# Patient Record
Sex: Male | Born: 1970 | Race: Black or African American | Hispanic: No | Marital: Single | Smoking: Current some day smoker
Health system: Southern US, Community
[De-identification: ages and names within clinical notes are randomized; demographics above are authoritative.]

## PROBLEM LIST (undated history)

## (undated) DIAGNOSIS — E119 Type 2 diabetes mellitus without complications: Secondary | ICD-10-CM

## (undated) DIAGNOSIS — I10 Essential (primary) hypertension: Secondary | ICD-10-CM

## (undated) DIAGNOSIS — E785 Hyperlipidemia, unspecified: Secondary | ICD-10-CM

## (undated) DIAGNOSIS — I428 Other cardiomyopathies: Secondary | ICD-10-CM

## (undated) DIAGNOSIS — I5022 Chronic systolic (congestive) heart failure: Secondary | ICD-10-CM

## (undated) DIAGNOSIS — Z720 Tobacco use: Secondary | ICD-10-CM

## (undated) HISTORY — DX: Chronic systolic (congestive) heart failure: I50.22

## (undated) HISTORY — DX: Essential (primary) hypertension: I10

## (undated) HISTORY — PX: KNEE SURGERY: SHX244

## (undated) HISTORY — DX: Hyperlipidemia, unspecified: E78.5

## (undated) HISTORY — DX: Tobacco use: Z72.0

## (undated) HISTORY — DX: Type 2 diabetes mellitus without complications: E11.9

## (undated) HISTORY — DX: Other cardiomyopathies: I42.8

## (undated) HISTORY — PX: CARDIAC CATHETERIZATION: SHX172

---

## 2012-12-25 LAB — CBC
MCH: 29.8 pg (ref 26.0–34.0)
MCHC: 33.1 g/dL (ref 32.0–36.0)
MCV: 90 fL (ref 80–100)
Platelet: 383 10*3/uL (ref 150–440)
RBC: 4.68 10*6/uL (ref 4.40–5.90)
WBC: 7.4 10*3/uL (ref 3.8–10.6)

## 2012-12-25 LAB — BASIC METABOLIC PANEL
BUN: 9 mg/dL (ref 7–18)
Calcium, Total: 9 mg/dL (ref 8.5–10.1)
Co2: 27 mmol/L (ref 21–32)
Creatinine: 1.08 mg/dL (ref 0.60–1.30)
EGFR (African American): >60
Glucose: 147 mg/dL — ABNORMAL HIGH (ref 65–99)
Osmolality: 277 (ref 275–301)

## 2012-12-25 LAB — PROTIME-INR: INR: 0.9

## 2012-12-25 LAB — CK TOTAL AND CKMB (NOT AT ARMC)
CK-MB: 3.5 ng/mL (ref 0.5–3.6)
CK-MB: 2.6 ng/mL (ref 0.5–3.6)

## 2012-12-26 ENCOUNTER — Inpatient Hospital Stay: Payer: Self-pay | Admitting: Internal Medicine

## 2012-12-26 DIAGNOSIS — I059 Rheumatic mitral valve disease, unspecified: Secondary | ICD-10-CM

## 2012-12-26 DIAGNOSIS — R079 Chest pain, unspecified: Secondary | ICD-10-CM

## 2012-12-26 LAB — CK TOTAL AND CKMB (NOT AT ARMC)
CK, Total: 233 U/L — ABNORMAL HIGH (ref 35–232)
CK-MB: 2 ng/mL (ref 0.5–3.6)

## 2012-12-26 LAB — LIPID PANEL
HDL Cholesterol: 36 mg/dL — ABNORMAL LOW (ref 40–60)
Triglycerides: 265 mg/dL — ABNORMAL HIGH (ref 0–200)
VLDL Cholesterol, Calc: 53 mg/dL — ABNORMAL HIGH (ref 5–40)

## 2012-12-29 ENCOUNTER — Telehealth: Payer: Self-pay

## 2012-12-29 NOTE — Telephone Encounter (Signed)
TCM call Pt says he is feeling much better since d/c Denies sob or CP Says he has had all RX filled and confirms appt with [place] 1/15 Says he has a BP monitor but needs new batteries; getting these today so he can monitor BP

## 2012-12-29 NOTE — Telephone Encounter (Signed)
Message copied by Marcelle Overlie on Mon Dec 29, 2012  9:26 AM ------      Message from: Thersa Salt      Created: Mon Dec 29, 2012  9:00 AM      Regarding: tcm       12/31/12 with chris

## 2012-12-30 ENCOUNTER — Encounter: Payer: Self-pay | Admitting: *Deleted

## 2012-12-31 ENCOUNTER — Encounter: Payer: Self-pay | Admitting: Nurse Practitioner

## 2012-12-31 ENCOUNTER — Ambulatory Visit (INDEPENDENT_AMBULATORY_CARE_PROVIDER_SITE_OTHER): Payer: 59 | Admitting: Nurse Practitioner

## 2012-12-31 ENCOUNTER — Telehealth: Payer: Self-pay

## 2012-12-31 VITALS — BP 100/70 | HR 70 | Ht 70.0 in | Wt 249.0 lb

## 2012-12-31 DIAGNOSIS — R0602 Shortness of breath: Secondary | ICD-10-CM

## 2012-12-31 DIAGNOSIS — I509 Heart failure, unspecified: Secondary | ICD-10-CM

## 2012-12-31 DIAGNOSIS — I10 Essential (primary) hypertension: Secondary | ICD-10-CM

## 2012-12-31 DIAGNOSIS — I5022 Chronic systolic (congestive) heart failure: Secondary | ICD-10-CM

## 2012-12-31 DIAGNOSIS — Z720 Tobacco use: Secondary | ICD-10-CM

## 2012-12-31 DIAGNOSIS — F17200 Nicotine dependence, unspecified, uncomplicated: Secondary | ICD-10-CM

## 2012-12-31 DIAGNOSIS — E785 Hyperlipidemia, unspecified: Secondary | ICD-10-CM | POA: Insufficient documentation

## 2012-12-31 DIAGNOSIS — I428 Other cardiomyopathies: Secondary | ICD-10-CM

## 2012-12-31 NOTE — Patient Instructions (Addendum)
Your physician wants you to follow-up in: 1 month with Dr. Mariah Milling. You will receive a reminder letter in the mail two months in advance. If you don't receive a letter, please call our office to schedule the follow-up appointment.

## 2012-12-31 NOTE — Progress Notes (Signed)
Patient Name: Logan Kerr Date of Encounter: 12/31/2012  Primary Care Provider:  No primary provider on file. Primary Cardiologist:  Concha Se, MD  Patient Profile  42 year old male with recent diagnosis of nonischemic cardiopathy presents for followup.  Problem List   Past Medical History  Diagnosis Date  . Hypertension   . Tobacco abuse   . Hyperlipidemia   . Chronic systolic CHF (congestive heart failure)     a. 12/2012 Echo: Mod dilated LV, EF <25%, sev global HK, mild conc LVH, mod dil LA, mild to mod MR.  . Nonischemic cardiomyopathy     a. 12/2012 Ex MV: Ex time 8:01, EF 21%, sev glob HK, no isch/infarct,    Past Surgical History  Procedure Date  . Knee surgery     right    Allergies  No Known Allergies  HPI  42 year old male with the above problem list. He was recently admitted to [facility] secondary to progressive dyspnea and found to be in heart failure. He was markedly hypertensive and was admitted for further evaluation and diuresis. A 2-D echocardiogram showed reduced LV function with an EF of less than 25%. Cardiology was consult in and he underwent stress testing revealing no evidence of ischemia or infarct. Was felt that his cardiomyopathy was likely hypertensive in nature. He was placed on beta blocker ACE inhibitor and diuretic therapy and subsequently discharged. Since his discharge, he reports doing well without any chest pain or dyspnea on exertion. He has weighed himself a few times since discharge and his weight has been stable.  He denies pnd, orthopnea, n, v, dizziness, syncope, edema, weight gain, or early satiety.  Home Medications  Prior to Admission medications   Medication Sig Start Date End Date Taking? Authorizing Provider  aspirin 81 MG tablet Take 81 mg by mouth daily.   Yes Historical Provider, MD  carvedilol (COREG) 6.25 MG tablet Take 6.25 mg by mouth 2 (two) times daily with a meal.   Yes Historical Provider, MD    furosemide (LASIX) 20 MG tablet Take 20 mg by mouth daily.   Yes Historical Provider, MD  lisinopril (PRINIVIL,ZESTRIL) 40 MG tablet Take 40 mg by mouth daily.   Yes Historical Provider, MD  lovastatin (MEVACOR) 40 MG tablet Take 40 mg by mouth at bedtime.   Yes Historical Provider, MD   Review of Systems  He denies pnd, orthopnea, n, v, dizziness, syncope, edema, weight gain, or early satiety. All other systems reviewed and are otherwise negative except as noted above.  Physical Exam  Blood pressure 100/70, pulse 70, height 5\' 10"  (1.778 m), weight 249 lb (112.946 kg).  General: Pleasant, NAD Psych: Normal affect. Neuro: Alert and oriented X 3. Moves all extremities spontaneously. HEENT: Normal  Neck: Supple without bruits or JVD. Lungs:  Resp regular and unlabored, CTA. Heart: RRR no s3, s4, or murmurs. Abdomen: Soft, non-tender, non-distended, BS + x 4.  Extremities: No clubbing, cyanosis or edema. DP/PT/Radials 2+ and equal bilaterally.  Accessory Clinical Findings  ECG - regular sinus rhythm, 70, LVH with repolarization abnormalities, no acute ST or T changes.  Assessment & Plan  1.  Chronic systolic congestive heart failure/nonischemic cardio myopathy: Patient reports doing well. He believes his weight has been stable and have recommended that he check a weight daily. We've gone over the importance of symptom reporting along with medication and lifestyle compliance. His blood pressure is soft and thus there is no room to add spironolactone therapy or titrate his  bb/acei. He'll require followup 2-D echocardiogram in approximately 3 months to determine his candidacy for ICD placement.  2. Hypertension: This is much better and he has been compliant with his medications. No changes today.  3. Hyperlipidemia: LDL was 129 while hospitalized. He did not have clinical evidence of atherosclerotic coronary disease and a negative Myoview. Consider discontinuation of statin therapy based  on latest guidelines update- defer to Dr. Mariah Milling.  4.  Tobacco Abuse:  He's smoked one cigarette since d/c.  He is committed to quitting.  Complete cessation advised.  5.  Dispo:  F/u Dr. Mariah Milling in 3-4 wks.  Nicolasa Ducking, NP 12/31/2012, 2:45 PM

## 2012-12-31 NOTE — Telephone Encounter (Signed)
Pt called to say he has noticed muscle spasms in hands and neck associated with the feeling as if he is going to pass out since starting carvedilol at d/c from hospital I spoke with Ward Givens about this since he saw pt this am He suggests pt decrease carvedilol to 3.125 mg BID and hold lovastatin for a few days to see if this helps symptoms Pt will let us know how he is feeling Understanding verb

## 2013-01-01 LAB — BASIC METABOLIC PANEL
BUN/Creatinine Ratio: 13 (ref 9–20)
CO2: 27 mmol/L (ref 19–28)
Creatinine, Ser: 1.27 mg/dL (ref 0.76–1.27)
GFR calc Af Amer: 81 mL/min/{1.73_m2} (ref >59)
Sodium: 139 mmol/L (ref 134–144)

## 2013-01-02 ENCOUNTER — Telehealth: Payer: Self-pay

## 2013-01-02 NOTE — Telephone Encounter (Signed)
Patient is calling back regarding his two medication changes. The patient states he feels a little better off the lovastatin and since the decrease in the carvedilol. Please advise what to do?

## 2013-01-02 NOTE — Telephone Encounter (Signed)
Pt says his cramping is better as well as his dizziness since holding lovastatin and decreasing coreg to 3.125 mg BID He feels lovastatin was not causing issues I advised ok to try resuming lovastatin and continue on coreg 3.125 mg BID I advised him to stop lovastatin if cramping resumes and we may have to switch his to another chol med Understanding verb

## 2013-01-05 ENCOUNTER — Telehealth: Payer: Self-pay

## 2013-01-05 NOTE — Telephone Encounter (Signed)
Message copied by Marcelle Overlie on Mon Jan 05, 2013  8:06 AM ------      Message from: Antonieta Iba      Created: Sun Jan 04, 2013  3:51 PM      Regarding: RE: labs       Would repeat in a few weeks.      Avoid foods high in potassium            ----- Message -----         From: Marcelle Overlie, RN         Sent: 01/02/2013   8:32 AM           To: Antonieta Iba, MD      Subject: labs                                                     See most recent labs ordered by Ward Givens      Do you need to repeat BMP d/t K?

## 2013-01-05 NOTE — Telephone Encounter (Signed)
Pt says symptoms have improved since medication change Will avoid foods high in potassium

## 2013-01-05 NOTE — Telephone Encounter (Signed)
Assess symptoms

## 2013-01-05 NOTE — Telephone Encounter (Signed)
Message copied by [facility], Chablis Losh E on Mon Jan 05, 2013  8:00 AM ------      Message from: [facility], Chayanne Filippi E      Created: Wed Dec 31, 2012  2:54 PM       Assess symptoms

## 2013-02-02 ENCOUNTER — Ambulatory Visit (INDEPENDENT_AMBULATORY_CARE_PROVIDER_SITE_OTHER): Payer: Self-pay | Admitting: Cardiovascular Disease

## 2013-02-02 ENCOUNTER — Telehealth: Payer: Self-pay

## 2013-02-02 ENCOUNTER — Encounter: Payer: Self-pay | Admitting: Cardiovascular Disease

## 2013-02-02 VITALS — BP 189/115 | HR 69 | Ht 70.0 in | Wt 249.2 lb

## 2013-02-02 DIAGNOSIS — E785 Hyperlipidemia, unspecified: Secondary | ICD-10-CM

## 2013-02-02 DIAGNOSIS — I509 Heart failure, unspecified: Secondary | ICD-10-CM

## 2013-02-02 DIAGNOSIS — Z720 Tobacco use: Secondary | ICD-10-CM

## 2013-02-02 DIAGNOSIS — F17200 Nicotine dependence, unspecified, uncomplicated: Secondary | ICD-10-CM

## 2013-02-02 DIAGNOSIS — I10 Essential (primary) hypertension: Secondary | ICD-10-CM

## 2013-02-02 DIAGNOSIS — I5022 Chronic systolic (congestive) heart failure: Secondary | ICD-10-CM

## 2013-02-02 MED ORDER — HYDRALAZINE HCL 25 MG PO TABS: 25.0000 mg | ORAL_TABLET | Freq: Three times a day (TID) | ORAL | Status: DC

## 2013-02-02 MED ORDER — ISOSORBIDE MONONITRATE ER 30 MG PO TB24: 30.0000 mg | ORAL_TABLET | Freq: Two times a day (BID) | ORAL | Status: DC

## 2013-02-02 NOTE — Assessment & Plan Note (Addendum)
Currently with no symptoms. No significant weight change. Appears stable but blood pressure is very elevated. Additional medications added today include hydralazine 25 g twice a day and isosorbide 30 mg daily. He has no insurance and needs medications from the $4  List.

## 2013-02-02 NOTE — Telephone Encounter (Signed)
Not taking KCL

## 2013-02-02 NOTE — Telephone Encounter (Signed)
You saw pt this am Do we need a repeat BMP? See BMP done 1/17 by Ward Givens thanks

## 2013-02-02 NOTE — Patient Instructions (Addendum)
Please start isosorbide one a day Also start hydralazine one pill three times a day for blood pressure  Continue all other meds  Please call us if you have new issues that need to be addressed before your next appt.  Your physician wants you to follow-up in: 1 months.

## 2013-02-02 NOTE — Assessment & Plan Note (Signed)
We have encouraged him to continue to work on weaning his cigarettes and smoking cessation. He will continue to work on this and does not want any assistance with chantix.  

## 2013-02-02 NOTE — Telephone Encounter (Signed)
Would suggest he stop potassium if he is taking this

## 2013-02-02 NOTE — Assessment & Plan Note (Signed)
We have stressed to him the importance of blood pressure control. He is reluctant to start additional medications but we discussed the complications of severe hypertension and he is willing to work on his blood pressure. Hydralazine and isosorbide started. these can be titrated upwards as needed

## 2013-02-02 NOTE — Progress Notes (Signed)
  HPI Comments: 42 year old male  recently admitted to [facility] secondary to progressive dyspnea and found to be in heart failure, markedly hypertensive, 2-D echocardiogram showed reduced LV function with an EF of less than 25%.  stress testing revealing no evidence of ischemia or infarct.  cardiomyopathy was likely hypertensive in nature. He was placed on beta blocker ACE inhibitor and diuretic therapy and subsequently discharged.   Since his discharge, he reports doing well without any chest pain or dyspnea on exertion. He has weighed himself a few times since discharge and his weight has been stable.  He denies pnd, orthopnea, n, v, dizziness, syncope, edema, weight gain, or early satiety.  He is not checking his blood pressure frequently so does report periodic blood pressure measurements in the 150 range. Continues to have no symptoms.  EKG shows normal sinus rhythm with no significant ST or T wave changes  Outpatient Encounter Prescriptions as of 02/02/2013  Medication Sig Dispense Refill  . aspirin 81 MG tablet Take 81 mg by mouth daily.      . carvedilol (COREG) 6.25 MG tablet Take 6.25 mg by mouth 2 (two) times daily with a meal.      . furosemide (LASIX) 20 MG tablet Take 20 mg by mouth daily.      Marland Kitchen lisinopril (PRINIVIL,ZESTRIL) 40 MG tablet Take 40 mg by mouth daily.      Marland Kitchen lovastatin (MEVACOR) 40 MG tablet Take 40 mg by mouth at bedtime.        Review of Systems  Constitutional: Negative.   HENT: Negative.   Eyes: Negative.   Respiratory: Negative.   Cardiovascular: Negative.   Gastrointestinal: Negative.   Musculoskeletal: Negative.   Skin: Negative.   Neurological: Negative.   Psychiatric/Behavioral: Negative.   All other systems reviewed and are negative.   BP 189/115  Pulse 69  Ht 5\' 10"  (1.778 m)  Wt 249 lb 4 oz (113.059 kg)  BMI 35.76 kg/m2 Recheck of the blood pressure confirmed systolic pressure greater than 170, diastolic 110 Physical Exam  Nursing  note and vitals reviewed. Constitutional: He is oriented to person, place, and time. He appears well-developed and well-nourished.  HENT:  Head: Normocephalic.  Nose: Nose normal.  Mouth/Throat: Oropharynx is clear and moist.  Eyes: Conjunctivae are normal. Pupils are equal, round, and reactive to light.  Neck: Normal range of motion. Neck supple. No JVD present.  Cardiovascular: Normal rate, regular rhythm, S1 normal, S2 normal, normal heart sounds and intact distal pulses.  Exam reveals no gallop and no friction rub.   No murmur heard. Pulmonary/Chest: Effort normal and breath sounds normal. No respiratory distress. He has no wheezes. He has no rales. He exhibits no tenderness.  Abdominal: Soft. Bowel sounds are normal. He exhibits no distension. There is no tenderness.  Musculoskeletal: Normal range of motion. He exhibits no edema and no tenderness.  Lymphadenopathy:    He has no cervical adenopathy.  Neurological: He is alert and oriented to person, place, and time. Coordination normal.  Skin: Skin is warm and dry. No rash noted. No erythema.  Psychiatric: He has a normal mood and affect. His behavior is normal. Judgment and thought content normal.      Assessment and Plan

## 2013-02-02 NOTE — Assessment & Plan Note (Signed)
recommended he continue on his statin

## 2013-02-02 NOTE — Telephone Encounter (Signed)
Reminder for BMP

## 2013-02-09 ENCOUNTER — Telehealth: Payer: Self-pay | Admitting: *Deleted

## 2013-02-09 NOTE — Telephone Encounter (Signed)
Please advise thanks.

## 2013-02-09 NOTE — Telephone Encounter (Signed)
Pt stop taking the isosorbide and the hydralazine. Pt says it was giving him headache, body aches, chills and other problems. Wants to know what he should do

## 2013-02-11 NOTE — Telephone Encounter (Signed)
Need to figure out which medicine was causing the side effect Would retry hydralazine and hold isosorbide It is a very low dose Would call back in several days time to let us know how he feels

## 2013-02-12 NOTE — Telephone Encounter (Signed)
Pt informed Understanding verb We will call him back to check on him in a few days

## 2013-02-18 ENCOUNTER — Telehealth: Payer: Self-pay

## 2013-02-18 NOTE — Telephone Encounter (Signed)
Assess symptoms

## 2013-02-18 NOTE — Telephone Encounter (Signed)
Pt reports symptoms improved on hydralazine alone. Continues to hold isosorbide and has had no further symptoms I advised him to continue meds as prescribed and I will inform Dr. Mariah Milling Understanding verb

## 2013-03-09 ENCOUNTER — Ambulatory Visit: Payer: Self-pay | Admitting: Cardiovascular Disease

## 2013-03-09 ENCOUNTER — Other Ambulatory Visit: Payer: Self-pay

## 2013-03-09 MED ORDER — LISINOPRIL 40 MG PO TABS: 40.0000 mg | ORAL_TABLET | Freq: Every day | ORAL | Status: DC

## 2013-03-13 ENCOUNTER — Ambulatory Visit (INDEPENDENT_AMBULATORY_CARE_PROVIDER_SITE_OTHER): Payer: Self-pay | Admitting: Cardiovascular Disease

## 2013-03-13 ENCOUNTER — Encounter: Payer: Self-pay | Admitting: Cardiovascular Disease

## 2013-03-13 VITALS — BP 128/78 | HR 75 | Ht 70.0 in | Wt 251.8 lb

## 2013-03-13 DIAGNOSIS — I10 Essential (primary) hypertension: Secondary | ICD-10-CM

## 2013-03-13 DIAGNOSIS — I5022 Chronic systolic (congestive) heart failure: Secondary | ICD-10-CM

## 2013-03-13 DIAGNOSIS — I509 Heart failure, unspecified: Secondary | ICD-10-CM

## 2013-03-13 DIAGNOSIS — E785 Hyperlipidemia, unspecified: Secondary | ICD-10-CM

## 2013-03-13 MED ORDER — ISOSORBIDE MONONITRATE ER 30 MG PO TB24: 30.0000 mg | ORAL_TABLET | Freq: Every day | ORAL | Status: AC

## 2013-03-13 MED ORDER — PRAVASTATIN SODIUM 40 MG PO TABS: 40.0000 mg | ORAL_TABLET | Freq: Every day | ORAL | Status: AC

## 2013-03-13 MED ORDER — CARVEDILOL 6.25 MG PO TABS: 6.2500 mg | ORAL_TABLET | Freq: Two times a day (BID) | ORAL | Status: AC

## 2013-03-13 MED ORDER — BENAZEPRIL HCL 40 MG PO TABS: 40.0000 mg | ORAL_TABLET | Freq: Every day | ORAL | Status: AC

## 2013-03-13 MED ORDER — FUROSEMIDE 20 MG PO TABS: 20.0000 mg | ORAL_TABLET | Freq: Every day | ORAL | Status: AC

## 2013-03-13 MED ORDER — HYDRALAZINE HCL 25 MG PO TABS: 25.0000 mg | ORAL_TABLET | Freq: Three times a day (TID) | ORAL | Status: AC

## 2013-03-13 NOTE — Patient Instructions (Addendum)
You are doing well. Continue to monitor your blood pressure Retry the isosorbide 30 mg pill before bed (ok to try 1/2 pill for headache)  Hold the lisinopril when script is done, change to benazepril one a day (40 mg) Change lovastatin to pravstatin one a day (pm)  Please call us if you have new issues that need to be addressed before your next appt.  Your physician wants you to follow-up in: 6 months.  You will receive a reminder letter in the mail two months in advance. If you don't receive a letter, please call our office to schedule the follow-up appointment.

## 2013-03-13 NOTE — Progress Notes (Signed)
HPI Comments: 42 year old male  recently admitted to [facility] secondary to progressive dyspnea and found to be in heart failure, markedly hypertensive, 2-D echocardiogram showed reduced LV function with an EF of less than 25%.  stress testing revealing no evidence of ischemia or infarct.  cardiomyopathy was likely hypertensive in nature. He was placed on beta blocker ACE inhibitor and diuretic therapy and subsequently discharged.   On his last clinic visit, blood pressure was elevated and he was started on hydralazine 25 mg  3 times a day and isosorbide 30 mg twice a day. He has tolerated hydralazine but did not tolerate isosorbide. He reported having a headache. He alsoreports the lisinopril is expensive at [employer], lovastatin also expensive.   He denies pnd, orthopnea, n, v, dizziness, syncope, edema, weight gain, or early satiety. Continues to have no symptoms.  EKG shows normal sinus rhythm With heart rate 75 beats per minute, diffuse nonspecific ST and T wave abnormality in V5, V6, 2, 3, aVF  Outpatient Encounter Prescriptions as of 03/13/2013  Medication Sig Dispense Refill  . aspirin 81 MG tablet Take 81 mg by mouth daily.      . carvedilol (COREG) 6.25 MG tablet Take 1 tablet (6.25 mg total) by mouth 2 (two) times daily with a meal.  180 tablet  4  . furosemide (LASIX) 20 MG tablet Take 1 tablet (20 mg total) by mouth daily.  90 tablet  4  . hydrALAZINE (APRESOLINE) 25 MG tablet Take 1 tablet (25 mg total) by mouth 3 (three) times daily.  270 tablet  4  . lisinopril (PRINIVIL,ZESTRIL) 40 MG tablet Take 1 tablet (40 mg total) by mouth daily.  30 tablet  6  .  lovastatin (MEVACOR) 40 MG tablet Take 40 mg by mouth at bedtime.        Review of Systems  Constitutional: Negative.   HENT: Negative.   Eyes: Negative.   Respiratory: Negative.   Cardiovascular: Negative.   Gastrointestinal: Negative.   Musculoskeletal: Negative.   Skin: Negative.   Neurological: Negative.    Psychiatric/Behavioral: Negative.   All other systems reviewed and are negative.   BP 128/78  Pulse 75  Ht 5\' 10"  (1.778 m)  Wt 251 lb 12 oz (114.193 kg)  BMI 36.12 kg/m2  Physical Exam  Nursing note and vitals reviewed. Constitutional: He is oriented to person, place, and time. He appears well-developed and well-nourished.  HENT:  Head: Normocephalic.  Nose: Nose normal.  Mouth/Throat: Oropharynx is clear and moist.  Eyes: Conjunctivae are normal. Pupils are equal, round, and reactive to light.  Neck: Normal range of motion. Neck supple. No JVD present.  Cardiovascular: Normal rate, regular rhythm, S1 normal, S2 normal, normal heart sounds and intact distal pulses.  Exam reveals no gallop and no friction rub.   No murmur heard. Pulmonary/Chest: Effort normal and breath sounds normal. No respiratory distress. He has no wheezes. He has no rales. He exhibits no tenderness.  Abdominal: Soft. Bowel sounds are normal. He exhibits no distension. There is no tenderness.  Musculoskeletal: Normal range of motion. He exhibits no edema and no tenderness.  Lymphadenopathy:    He has no cervical adenopathy.  Neurological: He is alert and oriented to person, place, and time. Coordination normal.  Skin: Skin is warm and dry. No rash noted. No erythema.  Psychiatric: He has a normal mood and affect. His behavior is normal. Judgment and thought content normal.      Assessment and Plan

## 2013-03-13 NOTE — Assessment & Plan Note (Addendum)
Blood pressure continues to run high at home with systolic pressure 100 4150. We will retry isosorbide 30 mg at nighttime. If unable to tolerate, may need to increase hydralazine to 50 mg 3 times a day. We'll change lisinopril to benazepril as high dose lisinopril was not on the [employer] list

## 2013-03-13 NOTE — Assessment & Plan Note (Addendum)
We'll change lovastatin to pravastatin as the former is not on the [employer]

## 2013-03-13 NOTE — Assessment & Plan Note (Signed)
Doing well with no recurrent signs of heart failure. Takes Lasix daily, weight stable

## 2015-04-08 NOTE — H&P (Signed)
PATIENT NAME:  Logan Kerr, Sem L MR#:  [id number] DATE OF BIRTH:  08/22/1971  DATE OF ADMISSION:  12/25/2012  PRIMARY CARE PHYSICIAN: The patient does not have one.   CHIEF COMPLAINT: Chest pain and shortness of breath.   HISTORY OF PRESENT ILLNESS: This is a 44 year old male who presents to the Emergency Room due to acute onset of chest tightness and shortness of breath. The patient says that he had been having some weakness and some shortness of breath over the past week or so but he thought it would go away. This morning when he woke up he was feeling more short of breath, more winded, and therefore came to the ER. In the Emergency Room, the patient on triage was noted to have systolic blood pressures over [id number] with diastolic pressures over 100. He does have a history of hypertension, but has not been taking his medications for a few months. He was noted to be in hypertensive urgency and emergently put on a nitroglycerin drip. He was also noted to be in mild congestive heart failure and also given some Lasix. His blood pressure since then has improved and his shortness of breath and chest tightness have also improved after being treated in the Emergency Room. He was noted to have a slightly elevated troponin at the same time. Hospitalist services were contacted for further treatment and evaluation. The patient presently denies any chest pain. His dyspnea has improved. He denies a cough, fevers, chills, nausea, vomiting, abdominal pain, dizziness, syncope, numbness, tingling or any other associated symptoms presently.   REVIEW OF SYSTEMS:   CONSTITUTIONAL: No documented fever. No weight gain or weight loss.   EYES: No blurred or double vision.   ENT: No tinnitus. No postnasal drip. No redness of the oropharynx.   RESPIRATORY: No cough. No wheeze. No hemoptysis. Positive dyspnea.   CARDIOVASCULAR: Positive chest pain. No orthopnea. No palpitations. No syncope.   GASTROINTESTINAL: No nausea. No  vomiting. No diarrhea. No abdominal pain. No melena. No hematochezia.   GENITOURINARY: No dysuria. No hematuria.   ENDOCRINE: No polyuria or nocturia. No heat or cold intolerance.   HEME: No anemia. No bruising. No bleeding.   INTEGUMENTARY: No rashes. No lesions.   MUSCULOSKELETAL: No arthritis. No swelling. No gout.   NEUROLOGIC: No numbness. No tingling. No ataxia. No seizure-type activity.   PSYCH: No anxiety. No insomnia. No ADD.   PAST MEDICAL HISTORY: 1. Hypertension.  2. Tobacco abuse.   ALLERGIES: No known drug allergies.   SOCIAL HISTORY: He does smoke about a pack every other day and has been smoking for the past 15 years. Occasional alcohol abuse. No illicit drug abuse. Lives at home with his girlfriend.  FAMILY HISTORY: Mother is alive, has diabetes and high blood pressure. Father died from a MI.    CURRENT MEDICATIONS: He is currently on no medications.   PHYSICAL EXAMINATION:  VITAL SIGNS: Temperature is 96, pulse 85, respirations 16, blood pressure 109/65 and sats 99% on 2 liters nasal cannula.   GENERAL: He is a pleasant appearing male in no apparent distress.   HEENT: Atraumatic, normocephalic. Extraocular muscles are intact. Pupils are equal and reactive to light. Sclerae are anicteric. No conjunctival injection. No pharyngeal erythema.   NECK: Supple. No jugular venous distention, no bruits, no lymphadenopathy and no thyromegaly.   CARDIAC: Regular rate and rhythm. No murmurs, no rubs and no clicks.   PULMONARY: Lungs are clear to auscultation bilaterally. No rales, no rhonchi and no wheezes.  ABDOMEN: Soft, flat, nontender and nondistended. Has good bowel sounds. No hepatosplenomegaly appreciated.   EXTREMITIES: No evidence of any cyanosis, clubbing or peripheral edema. Has +2 pedal and radial pulses bilaterally.   NEUROLOGICAL: The patient is alert, awake and oriented x 3 with no focal motor or sensory deficits appreciated bilaterally.   SKIN:  Moist and warm with no rash appreciated.   LYMPHATIC: There is no cervical or axillary lymphadenopathy.  LABS/RADIOLOGIC STUDIES: Serum glucose is 147, BUN 9, creatinine 1.08, sodium 138, potassium 3.7, chloride 104 and bicarbonate 27. Troponin is mildly elevated at 0.06. White cell count is 7.4, hemoglobin 13.9, hematocrit 42.1 and platelet count 383. INR is 0.9.   The patient did have a chest x-ray done which showed findings consistent with CHF with mild interstitial edema, no evidence of pneumonia or any significant pleural effusion.   ASSESSMENT AND PLAN: This is a 44 year old male with a history of hypertension and ongoing tobacco use who presents to the hospital with shortness of breath and chest tightness and noted to be hypertensive urgency with an elevated troponin. 1. Malignant hypertension/hypertensive urgency. This is likely related to medical noncompliance. The patient has not seen a physician in the past 3 years and has not been on his meds over 6 months. He used to be on lisinopril/hydrochlorothiazide but has not taken it. He was started on a nitroglycerin drip in the Emergency Room which seems to have significantly improved his symptoms. For now I will taper him off the nitroglycerin drip, restart him back on his lisinopril and hydrochlorothiazide and we will also add some p.r.n. hydralazine and follow his hemodynamics.  2. Shortness of breath/chest pressure. This is likely related to the malignant hypertension and also underlying suspected congestive heart failure. Likely this is diastolic dysfunction secondary to hypertensive urgency, but I will go ahead and get a two-dimensional echocardiogram. He has clinically improved quite a bit after receiving some Lasix in the Emergency Room. His lungs are pretty clear. Presently he does not appear to be hypoxic. Therefore, I will dose Lasix on a p.r.n. basis at this point.  3. Elevated troponin. This is likely in the setting of demand ischemia  and hypertensive urgency. Unlikely this is an acute coronary syndrome. EKG is consistent with LVH with voltage criteria, but no other acute ST or T wave changes. He is currently chest pain free. I will cycle his cardiac markers and get a two-dimensional echocardiogram.   CODE STATUS: FULL CODE.  TIME SPENT ON ADMISSION: 45 minutes.  ____________________________ Rolly Pancake. Cherlynn Kaiser, MD vjs:sb D: 12/25/2012 13:09:37 ET T: 12/25/2012 14:29:21 ET JOB#: [id number]  cc: Rolly Pancake. Cherlynn Kaiser, MD, <Dictator> Houston Siren MD ELECTRONICALLY SIGNED 12/30/2012 15:33

## 2015-04-08 NOTE — Consult Note (Signed)
General Aspect 44 year old male who presents to the Emergency Room with chest tightness and shortness of breath. Cardiology was consulted for depressed ejection fraction/cardiomyopathy.  he has been having some weakness and shortness of breath over the past week or so but he thought it would go away. The day of admission,  he woke up he was feeling more short of breath, more winded, and therefore came to the ER.He does have a history of hypertension, but has not been taking his medications for a few months.    In the Emergency Room, the systolic blood pressures was over 230 with diastolic pressures over 100. He was noted to be in hypertensive urgency and emergently put on a nitroglycerin drip. He was given some Lasix. His blood pressure  improved and his shortness of breath and chest tightness  also improved after being treated in the Emergency Room.  The patient presently denies any chest pain. His dyspnea has improved. He denies a cough, fevers, chills, nausea, vomiting, abdominal pain, dizziness, syncope, numbness, tingling or any other associated symptoms presently.    Present Illness . PAST MEDICAL HISTORY: 1. Hypertension.  2. Tobacco abuse.   ALLERGIES: No known drug allergies.   SOCIAL HISTORY: He does smoke about a pack every other day and has been smoking for the past 15 years. Occasional alcohol abuse. No illicit drug abuse. Lives at home with his girlfriend.  FAMILY HISTORY: Mother is alive, has diabetes and high blood pressure. Father died from a MI.    CURRENT MEDICATIONS: He is currently on no medications.   Lab Results:  Routine Chem:  10-Jan-14 01:13    Result Comment TROPONIN - RESULTS VERIFIED BY REPEAT TESTING.  - PREVIOUS CALL:12/25/12@1005   - TPL  Result(s) reported on 26 Dec 2012 at 02:01AM.   Cholesterol, Serum  218   Triglycerides, Serum  265   HDL (INHOUSE)  36   VLDL Cholesterol Calculated  53   LDL Cholesterol Calculated  129 (Result(s) reported on 26 Dec 2012 at 02:04AM.)  Cardiac:  10-Jan-14 01:13    CK, Total  233   CPK-MB, Serum 2.0 (Result(s) reported on 26 Dec 2012 at 01:53AM.)   Troponin I  0.06 (0.00-0.05 0.05 ng/mL or less: NEGATIVE  Repeat testing in 3-6 hrs  if clinically indicated. >0.05 ng/mL: POTENTIAL  MYOCARDIAL INJURY. Repeat  testing in 3-6 hrs if  clinically indicated. NOTE: An increase or decrease  of 30% or more on serial  testing suggests a  clinically important change)   EKG:   Interpretation EKG shows sinus tachycardia rate 122 bpm, LVH by voltage    No Known Allergies:   Vital Signs/Nurse's Notes: **Vital Signs.:   10-Jan-14 12:02   Vital Signs Type Routine   Temperature Temperature (F) 98.9   Celsius 37.1   Temperature Source oral   Pulse Pulse 77   Respirations Respirations 18   Systolic BP Systolic BP 105   Diastolic BP (mmHg) Diastolic BP (mmHg) 70   Mean BP 81   Pulse Ox % Pulse Ox % 95   Oxygen Delivery Room Air/ 21 %     Impression 44 year old male who presents to the Emergency Room with chest tightness and shortness of breath. Cardiology was consulted for depressed ejection fraction/cardiomyopathy.  1) Chest pain/SOB: From acute systolic CHF, severe HTN Improved sx after diuresis, improved BP Cardiac enz negative --COntinue current meds  2) cardiologyopathy, EF <25% Etiology uncertain Risk factors for CAD/ischemia (smoking, hyperlipidemia, family hx) unable to exclude  severe chronic HTN. No recent viral. Idiopathic? --Consider stress myoview, possibly tomorrow or as an outpt Continue ACE, coreg BID, lasix 20 mg daily, daily weight Follow up in clinic in 1 to 2 weeks  3) HTN: improved on current meds.   Electronic Signatures: Julien NordmannGollan, Timothy (MD)  (Signed 10-Jan-14 14:15)  Authored: General Aspect/Present Illness, Home Medications, Labs, EKG , Allergies, Vital Signs/Nurse's Notes, Impression/Plan   Last Updated: 10-Jan-14 14:15 by Julien NordmannGollan, Timothy (MD)

## 2015-04-08 NOTE — Discharge Summary (Signed)
PATIENT NAME:  Logan Kerr, Logan Kerr MR#:  [id number] DATE OF BIRTH:  Nov 24, 1971  DATE OF ADMISSION:  12/26/2012 DATE OF DISCHARGE:  12/27/2012  ADMITTING PHYSICIAN:  Hilda LiasVivek Sainani.  DISCHARGING PHYSICIAN: Enid BaasRadhika Nico Rogness.  PRIMARY CARE PHYSICIAN: None.   CONSULTATIONS IN THE HOSPITAL: Cardiology consultation by Dr. Mariah MillingGollan.   DISCHARGE DIAGNOSES: 1.  Hypertensive urgency.  2.  Severe, probably nonischemic cardiomyopathy with ejection fraction of 25%.  3.  Acute systolic congestive heart failure.  4.  Elevated troponins from demand ischemia and congestive heart failure.  5.  Hyperlipidemia.   DISCHARGE HOME MEDICATIONS:  1.  Lisinopril 40 mg p.o. daily.  2.  Lovastatin 40 mg p.o. daily.  3.  Furosemide 20 mg p.o. daily.  4.  Coreg 6.25 mg p.o. b.i.d.  5.  Aspirin 81 mg p.o. daily.   DISCHARGE DIET: Low sodium, low fat diet.   DISCHARGE ACTIVITY: As tolerated.    FOLLOWUP INSTRUCTIONS: 1.  PCP followup  in 2 to 4 weeks.  2.  Cardiology followup  in 2 to 3 weeks.   LABS AND IMAGING STUDIES:  1.  WBC 7.4, hemoglobin 13.9, hematocrit 42.1, platelet count 383.  2.  Sodium 138, potassium 3.7, chloride 104, bicarbonate 27, BUN 9, creatinine 1.08 and glucose of 147, calcium 9.0.  3.  Troponin slightly elevated at 0.06 on admission.  4.  Chest x-ray showing findings consistent with CHF with mild interstitial edema.  5.  LDL cholesterol 129, HDL 36, total cholesterol 045218 and triglycerides of 265.  6.  Echo Doppler showing severely dilated LV with reduced LV systolic function, EF less than 25% , estimated calculation is around 15%; severe global hypokinesis of left ventricle and moderately dilated left atrium is seen.  7.  Lexiscan showing negative exercise stress test, negative Lexiscan test with severe LV  systolic dysfunction, EF calculated to be around 21%, severe global hypokinesis is seen.  No scar or ischemia seen on analysis.   BRIEF HOSPITAL COURSE: The patient is a Logan year old  African American Kerr with known history of hypertension, not taking any medications, presented to the hospital secondary to chest pain and dyspnea.  1.  Acute systolic CHF, found to be in mild pulmonary edema on chest x-ray; received Lasix. Breathing has improved. Had an echo done which showed severe cardiomyopathy, EF of less than 25%. Had a Lexiscan Myoview done which was negative for any ischemia so likely nonischemic cardiomyopathy probably worsened by hypertension so he was started on medications with aspirin, statin, Coreg and also lisinopril, and will follow up with cardiology as an outpatient.  2.  Hypertensive urgency, improved after the medications were started. He is being discharged on Coreg, lisinopril and Lasix as mentioned above.  3.  Elevated troponins, likely demand ischemia.  4.  Hyperlipidemia. Being discharged on statin.   His course has been otherwise uneventful in the hospital.  He was advised about the importance of being compliant to his medications and also followup as an outpatient. His course has been otherwise uneventful.    DISCHARGE CONDITION: Stable.   DISCHARGE DISPOSITION: Home.   TIME SPENT ON DISCHARGE: 45 minutes.     ____________________________ Enid Baasadhika Bettyanne Dittman, MD rk:cs D: 12/29/2012 14:48:00 ET T: 12/29/2012 20:56:00 ET JOB#: [id number]  cc: Enid Baasadhika Denisia Harpole, MD, <Dictator> Enid BaasADHIKA Jaydrian Corpening MD ELECTRONICALLY SIGNED 01/07/2013 19:29

## 2018-04-01 ENCOUNTER — Emergency Department: Payer: Self-pay

## 2018-04-01 ENCOUNTER — Other Ambulatory Visit: Payer: Self-pay

## 2018-04-01 ENCOUNTER — Emergency Department
Admission: EM | Admit: 2018-04-01 | Discharge: 2018-04-01 | Disposition: A | Discharge status: Home / Self Care | Payer: Self-pay | Attending: Student in an Organized Health Care Education/Training Program | Admitting: Student in an Organized Health Care Education/Training Program

## 2018-04-01 DIAGNOSIS — F17210 Nicotine dependence, cigarettes, uncomplicated: Secondary | ICD-10-CM | POA: Insufficient documentation

## 2018-04-01 DIAGNOSIS — I110 Hypertensive heart disease with heart failure: Secondary | ICD-10-CM | POA: Insufficient documentation

## 2018-04-01 DIAGNOSIS — Z7982 Long term (current) use of aspirin: Secondary | ICD-10-CM | POA: Insufficient documentation

## 2018-04-01 DIAGNOSIS — I5022 Chronic systolic (congestive) heart failure: Secondary | ICD-10-CM | POA: Insufficient documentation

## 2018-04-01 DIAGNOSIS — R0602 Shortness of breath: Secondary | ICD-10-CM | POA: Insufficient documentation

## 2018-04-01 DIAGNOSIS — Z79899 Other long term (current) drug therapy: Secondary | ICD-10-CM | POA: Insufficient documentation

## 2018-04-01 LAB — CBC
HCT: 41.5 % (ref 40.0–52.0)
HEMOGLOBIN: 13.8 g/dL (ref 13.0–18.0)
MCH: 30.4 pg (ref 26.0–34.0)
MCHC: 33.3 g/dL (ref 32.0–36.0)
MCV: 91.2 fL (ref 80.0–100.0)
PLATELETS: 347 10*3/uL (ref 150–440)
RBC: 4.55 MIL/uL (ref 4.40–5.90)
RDW: 13 % (ref 11.5–14.5)
WBC: 5.3 10*3/uL (ref 3.8–10.6)

## 2018-04-01 LAB — BASIC METABOLIC PANEL
ANION GAP: 5 (ref 5–15)
BUN: 15 mg/dL (ref 6–20)
CO2: 27 mmol/L (ref 22–32)
CREATININE: 1.11 mg/dL (ref 0.61–1.24)
Calcium: 8.9 mg/dL (ref 8.9–10.3)
Chloride: 101 mmol/L (ref 101–111)
GFR calc Af Amer: >60 mL/min (ref >60)
Glucose, Bld: 124 mg/dL — ABNORMAL HIGH (ref 65–99)
Potassium: 3.9 mmol/L (ref 3.5–5.1)
SODIUM: 133 mmol/L — AB (ref 135–145)

## 2018-04-01 LAB — TROPONIN I

## 2018-04-01 MED ORDER — IPRATROPIUM-ALBUTEROL 0.5-2.5 (3) MG/3ML IN SOLN
3.0000 mL | Freq: Once | RESPIRATORY_TRACT | Status: AC
  Administered 2018-04-01: 3 mL via RESPIRATORY_TRACT
  Filled 2018-04-01: qty 3

## 2018-04-01 MED ORDER — PREDNISONE 20 MG PO TABS: 40.0000 mg | ORAL_TABLET | Freq: Every day | ORAL | 0 refills | Status: AC

## 2018-04-01 MED ORDER — ALBUTEROL SULFATE HFA 108 (90 BASE) MCG/ACT IN AERS: 2.0000 | INHALATION_SPRAY | Freq: Four times a day (QID) | RESPIRATORY_TRACT | 2 refills | Status: AC | PRN

## 2018-04-01 MED ORDER — FLUTICASONE PROPIONATE 50 MCG/ACT NA SUSP: 1.0000 | Freq: Every day | NASAL | 0 refills | Status: AC

## 2018-04-01 NOTE — ED Notes (Signed)
Patient ambulatory to Room 9, alert and oriented, NAD.  Nicholaus BloomKelley RN aware of placement in room.

## 2018-04-01 NOTE — ED Notes (Signed)
E-signature page not working in [employer]. Signature page printed out and pt signed on paper.

## 2018-04-01 NOTE — ED Provider Notes (Signed)
[facility] Emergency Department Provider Note    First MD Initiated Contact with Patient 04/01/18 1036     (approximate)  I have reviewed the triage vital signs and the nursing notes.   HISTORY  Chief Complaint Chest Pain    HPI Logan Kerr is a 47 y.o. male with a history of CHF hypertension and tobacco use smoking roughly 1/4 pack/day presents with 3 days of worsening shortness of breath nasal congestion and nonproductive cough.  No fevers.  Denies any chest pain.  No orthopnea.  No abdominal pain.  No nausea or vomiting.  Was given a nebulizer treatment with improvement in symptoms.  Past Medical History:  Diagnosis Date  . Chronic systolic CHF (congestive heart failure) (HCC)    a. 12/2012 Echo: Mod dilated LV, EF <25%, sev global HK, mild conc LVH, mod dil LA, mild to mod MR.  Marland Kitchen. Hyperlipidemia   . Hypertension   . Nonischemic cardiomyopathy (HCC)    a. 12/2012 Ex MV: Ex time 8:01, EF 21%, sev glob HK, no isch/infarct,   . Tobacco abuse    Family History  Problem Relation Age of Onset  . Hypertension Mother   . Heart attack Father   . Hypertension Father    Past Surgical History:  Procedure Laterality Date  . CARDIAC CATHETERIZATION    . KNEE SURGERY     right   Patient Active Problem List   Diagnosis Date Noted  . Nonischemic cardiomyopathy (HCC)   . Chronic systolic CHF (congestive heart failure) (HCC)   . Hyperlipidemia   . Tobacco abuse   . Hypertension       Prior to Admission medications   Medication Sig Start Date End Date Taking? Authorizing Provider  albuterol (PROVENTIL HFA;VENTOLIN HFA) 108 (90 Base) MCG/ACT inhaler Inhale 2 puffs into the lungs every 6 (six) hours as needed for wheezing or shortness of breath. 04/01/18   Willy Eddyobinson, Keyon Liller, MD  aspirin 81 MG tablet Take 81 mg by mouth daily.    [provider]  benazepril (LOTENSIN) 40 MG tablet Take 1 tablet (40 mg total) by mouth daily. 03/13/13   Antonieta IbaGollan,  Timothy J, MD  carvedilol (COREG) 6.25 MG tablet Take 1 tablet (6.25 mg total) by mouth 2 (two) times daily with a meal. 03/13/13   Gollan, Tollie Pizzaimothy J, MD  fluticasone (FLONASE) 50 MCG/ACT nasal spray Place 1 spray into both nostrils daily. 04/01/18 04/01/19  Willy Eddyobinson, Kjirsten Bloodgood, MD  furosemide (LASIX) 20 MG tablet Take 1 tablet (20 mg total) by mouth daily. 03/13/13   Antonieta IbaGollan, Timothy J, MD  hydrALAZINE (APRESOLINE) 25 MG tablet Take 1 tablet (25 mg total) by mouth 3 (three) times daily. 03/13/13   Antonieta IbaGollan, Timothy J, MD  isosorbide mononitrate (IMDUR) 30 MG 24 hr tablet Take 1 tablet (30 mg total) by mouth daily. 03/13/13   Antonieta IbaGollan, Timothy J, MD  pravastatin (PRAVACHOL) 40 MG tablet Take 1 tablet (40 mg total) by mouth daily. 03/13/13   Antonieta IbaGollan, Timothy J, MD  predniSONE (DELTASONE) 20 MG tablet Take 2 tablets (40 mg total) by mouth daily for 5 days. 04/01/18 04/06/18  Willy Eddyobinson, Chesley Veasey, MD  spironolactone (ALDACTONE) 25 MG tablet Take 12.5 mg by mouth daily. 03/21/18   [provider]    Allergies Patient has no known allergies.    Social History Social History   Tobacco Use  . Smoking status: Current Some Day Smoker    Packs/day: 0.25    Years: 20.00    Pack  years: 5.00    Types: Cigarettes  . Smokeless tobacco: Never Used  Substance Use Topics  . Alcohol use: Yes  . Drug use: No    Review of Systems Patient denies headaches, rhinorrhea, blurry vision, numbness, shortness of breath, chest pain, edema, cough, abdominal pain, nausea, vomiting, diarrhea, dysuria, fevers, rashes or hallucinations unless otherwise stated above in HPI. ____________________________________________   PHYSICAL EXAM:  VITAL SIGNS: Vitals:   04/01/18 0734 04/01/18 1123  BP: (!) 180/118 (!) 165/109  Pulse:  80  Resp:  16  Temp:    SpO2: 94% 96%    Constitutional: Alert and oriented. Well appearing and in no acute distress. Eyes: Conjunctivae are normal.  Head: Atraumatic. Nose: No  congestion/rhinnorhea. Mouth/Throat: Mucous membranes are moist.   Neck: No stridor. Painless ROM.  Cardiovascular: Normal rate, regular rhythm. Grossly normal heart sounds.  Good peripheral circulation. Respiratory: Normal respiratory effort.  No retractions. Lungs with bibasilar wheeze Gastrointestinal: Soft and nontender. No distention. No abdominal bruits. No CVA tenderness. Genitourinary:  Musculoskeletal: No lower extremity tenderness nor edema.  No joint effusions. Neurologic:  Normal speech and language. No gross focal neurologic deficits are appreciated. No facial droop Skin:  Skin is warm, dry and intact. No rash noted. Psychiatric: Mood and affect are normal. Speech and behavior are normal.  ____________________________________________   LABS (all labs ordered are listed, but only abnormal results are displayed)  Results for orders placed or performed during the hospital encounter of 04/01/18 (from the past 24 hour(s))  Basic metabolic panel     Status: Abnormal   Collection Time: 04/01/18  7:35 AM  Result Value Ref Range   Sodium 133 (L) 135 - 145 mmol/L   Potassium 3.9 3.5 - 5.1 mmol/L   Chloride 101 101 - 111 mmol/L   CO2 27 22 - 32 mmol/L   Glucose, Bld 124 (H) 65 - 99 mg/dL   BUN 15 6 - 20 mg/dL   Creatinine, Ser 1.61 0.61 - 1.24 mg/dL   Calcium 8.9 8.9 - 09.6 mg/dL   GFR calc non Af Amer >60 >60 mL/min   GFR calc Af Amer >60 >60 mL/min   Anion gap 5 5 - 15  CBC     Status: None   Collection Time: 04/01/18  7:35 AM  Result Value Ref Range   WBC 5.3 3.8 - 10.6 K/uL   RBC 4.55 4.40 - 5.90 MIL/uL   Hemoglobin 13.8 13.0 - 18.0 g/dL   HCT 04.5 40.9 - 81.1 %   MCV 91.2 80.0 - 100.0 fL   MCH 30.4 26.0 - 34.0 pg   MCHC 33.3 32.0 - 36.0 g/dL   RDW 91.4 78.2 - 95.6 %   Platelets 347 150 - 440 K/uL  Troponin I     Status: None   Collection Time: 04/01/18  7:35 AM  Result Value Ref Range   Troponin I <0.03 <0.03 ng/mL    ____________________________________________  EKG My review and personal interpretation at Time: 7:32   Indication: sob  Rate: 100  Rhythm: sinus Axis: normal  Other: normal intervals, no stemi, t wave inversions in inferior leads unchanged from previos ____________________________________________  RADIOLOGY  I personally reviewed all radiographic images ordered to evaluate for the above acute complaints and reviewed radiology reports and findings.  These findings were personally discussed with the patient.  Please see medical record for radiology report.  ____________________________________________   PROCEDURES  Procedure(s) performed:  Procedures    Critical Care performed: no ____________________________________________  INITIAL IMPRESSION / ASSESSMENT AND PLAN / ED COURSE  Pertinent labs & imaging results that were available during my care of the patient were reviewed by me and considered in my medical decision making (see chart for details).  DDX: bronchitis, copd, chf, pna  Logan Kerr is a 47 y.o. who presents to the ED with symptoms as described above.  Patient does have wheeze on exam.  No significant orthopnea or lower extremity swelling.  Not clinically consistent with significant CHF exacerbation.  Denies any chest pain at this time.  Patient given nebulizer treatment with improvement in symptoms therefore we will treat for bronchitis given his congestion.  No evidence of pneumonia.  Not clinically consistent with PE.  Have discussed with the patient and available family all diagnostics and treatments performed thus far and all questions were answered to the best of my ability. The patient demonstrates understanding and agreement with plan.       As part of my medical decision making, I reviewed the following data within the electronic MEDICAL RECORD NUMBER Nursing notes reviewed and incorporated, Labs reviewed, notes from prior ED visits and [place] Controlled  Substance Database   ____________________________________________   FINAL CLINICAL IMPRESSION(S) / ED DIAGNOSES  Final diagnoses:  Shortness of breath      NEW MEDICATIONS STARTED DURING THIS VISIT:  Discharge Medication List as of 04/01/2018 11:29 AM    START taking these medications   Details  albuterol (PROVENTIL HFA;VENTOLIN HFA) 108 (90 Base) MCG/ACT inhaler Inhale 2 puffs into the lungs every 6 (six) hours as needed for wheezing or shortness of breath., Starting Tue 04/01/2018, Print    fluticasone (FLONASE) 50 MCG/ACT nasal spray Place 1 spray into both nostrils daily., Starting Tue 04/01/2018, Until Wed 04/01/2019, Print    predniSONE (DELTASONE) 20 MG tablet Take 2 tablets (40 mg total) by mouth daily for 5 days., Starting Tue 04/01/2018, Until Sun 04/06/2018, Print         Note:  This document was prepared using Dragon voice recognition software and may include unintentional dictation errors.    Willy Eddy, MD 04/01/18 [phone]

## 2018-04-01 NOTE — ED Notes (Signed)
Dr Robinson at bedside 

## 2018-04-01 NOTE — ED Triage Notes (Signed)
Pt c/o left sided chest pain with SOB that started this morning. Pt is in NAD on arrival, skin is warm and dry. Respirations WNL.. Pt states he has a hx of CHF..Logan Kerr

## 2019-05-14 IMAGING — CR DG CHEST 2V
1 series · 2 of 2 positions shown · non-contrast
Comparison: Radiographs December 25, 2012.

CLINICAL DATA: Chest pain, shortness of breath.

EXAM:
CHEST - 2 VIEW

[Series 1: dg chest 2 view · 0.14mm/px · 2 of 2 slices shown]
[im 1/2]
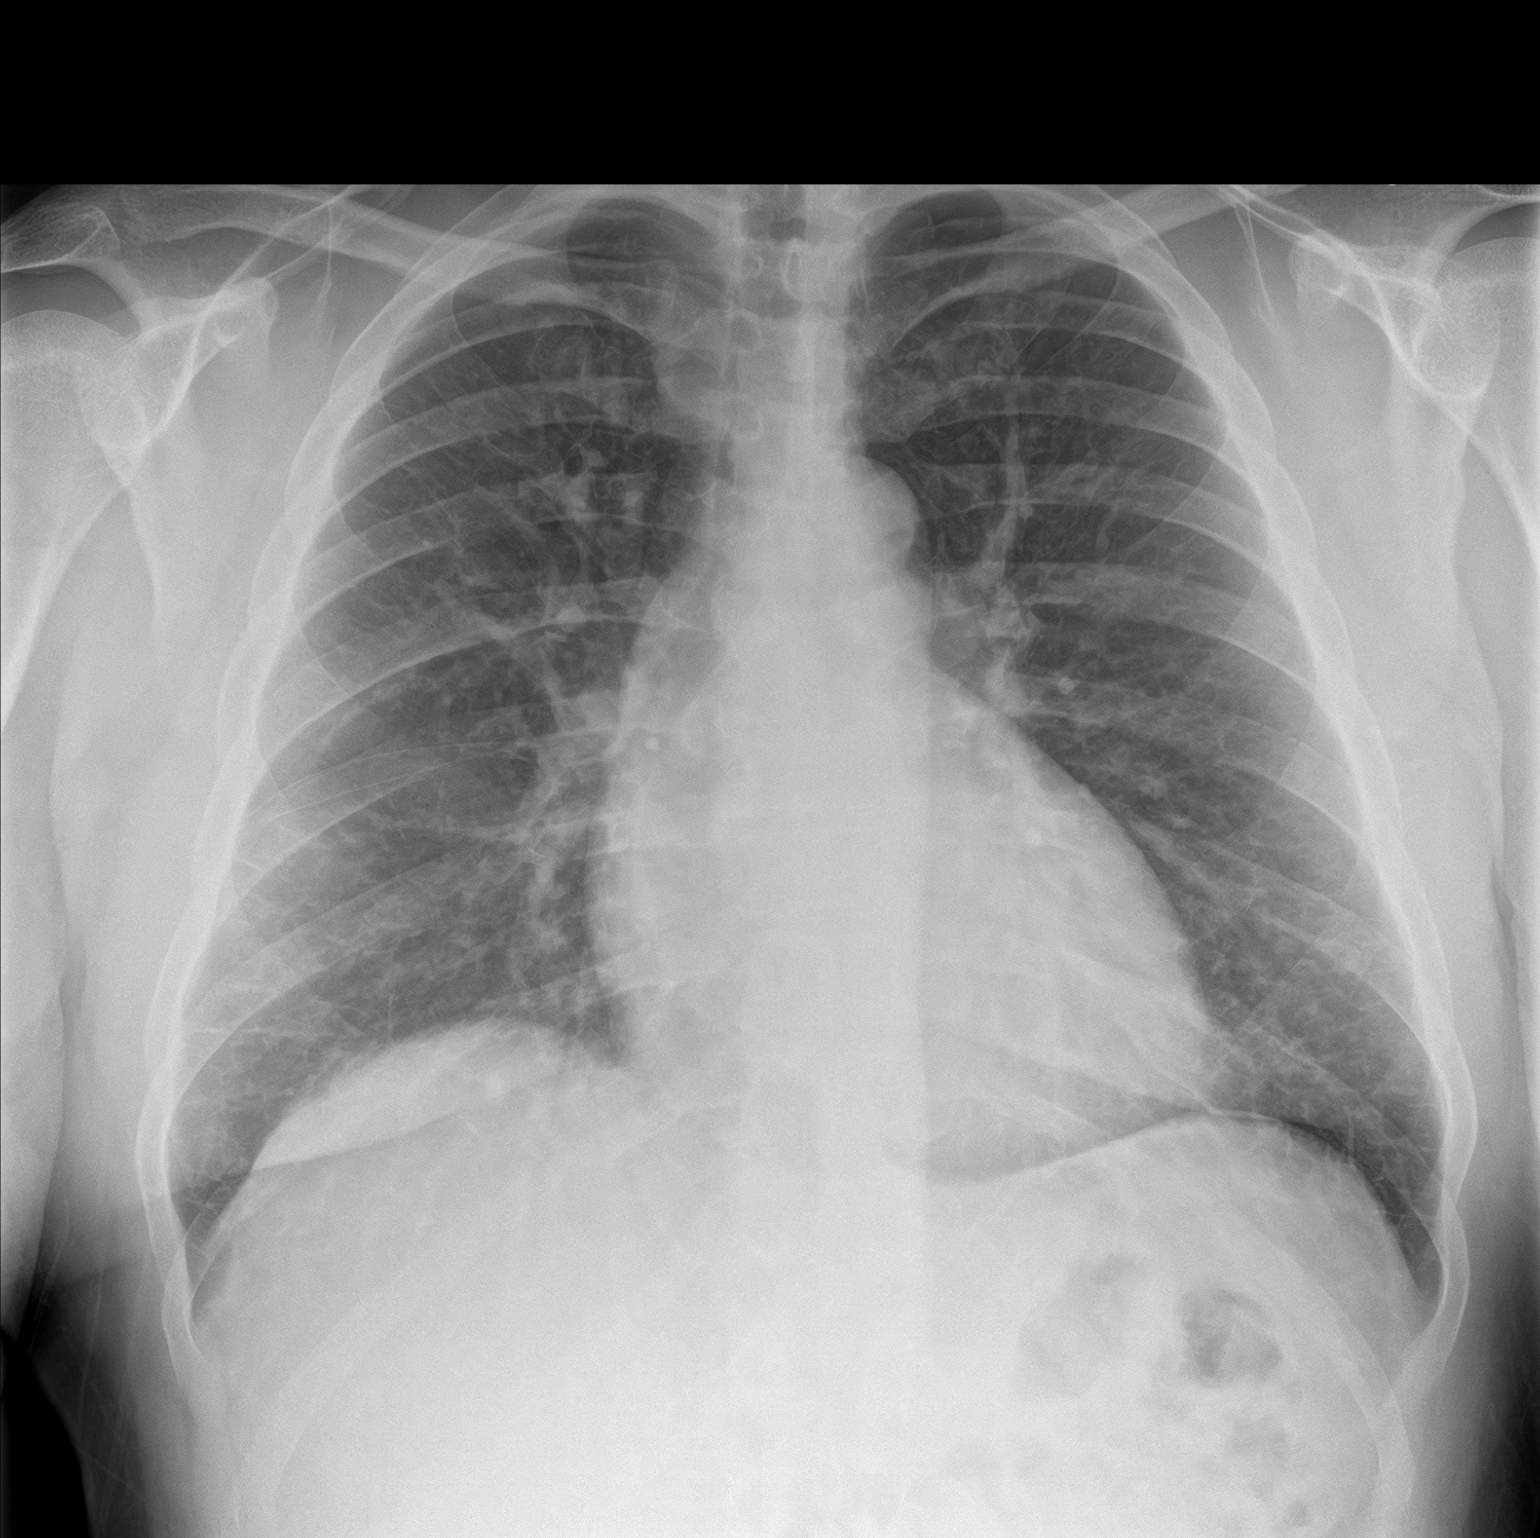
[im 2/2]
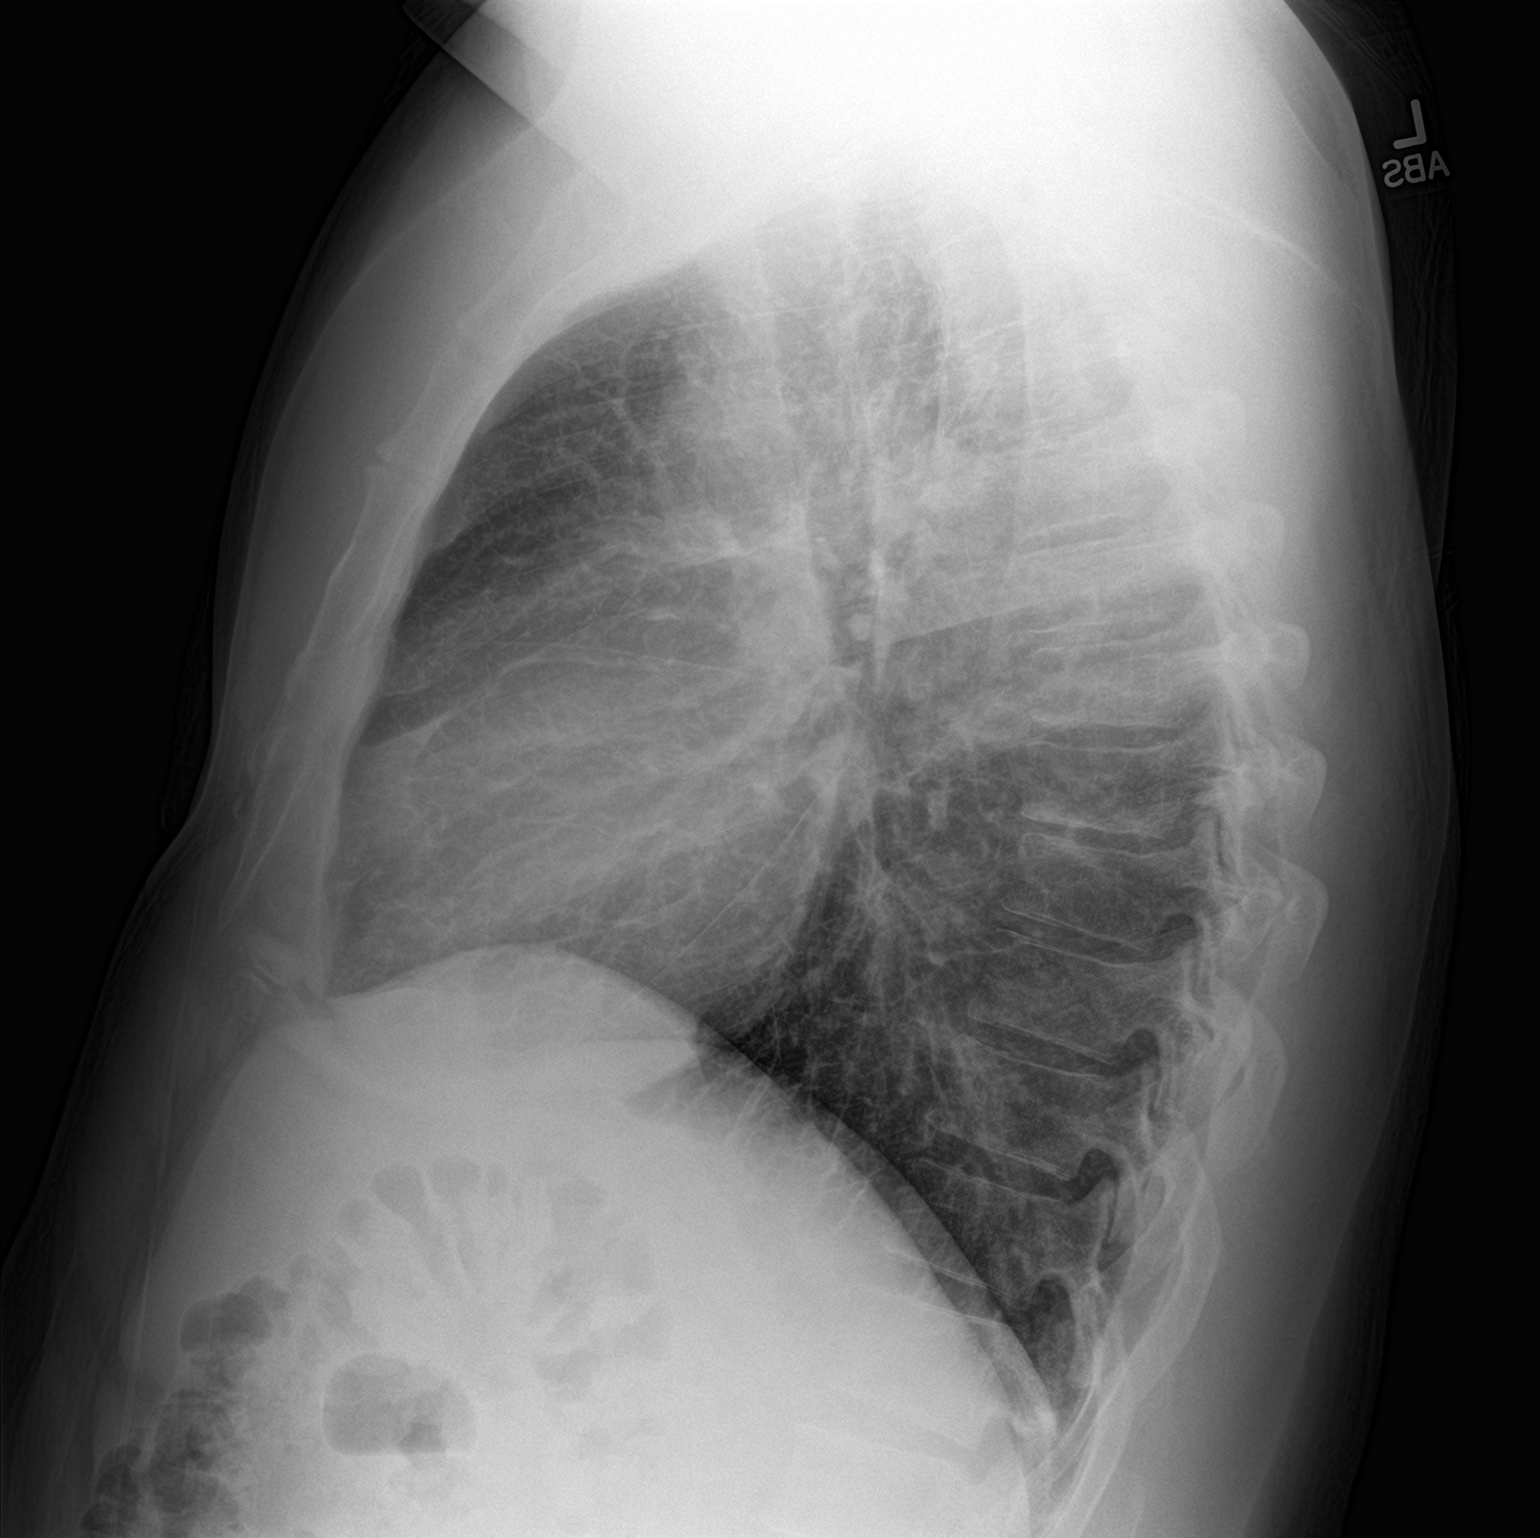

[2 of 2 positions shown; findings below may reference images not displayed]

FINDINGS: The heart size and mediastinal contours are within normal limits. No
pneumothorax or pleural effusion is noted. Stable bibasilar
interstitial densities are noted most consistent with scarring. No
acute pulmonary disease is noted. The visualized skeletal structures
are unremarkable.
IMPRESSION: Stable bibasilar scarring. No acute cardiopulmonary abnormality
seen.

## 2022-05-17 ENCOUNTER — Emergency Department: Payer: 59

## 2022-05-17 ENCOUNTER — Emergency Department
Admission: EM | Admit: 2022-05-17 | Discharge: 2022-05-17 | Disposition: A | Discharge status: Home / Self Care | Payer: 59 | Attending: Emergency Medicine | Admitting: Emergency Medicine

## 2022-05-17 ENCOUNTER — Other Ambulatory Visit: Payer: Self-pay

## 2022-05-17 DIAGNOSIS — Y9361 Activity, american tackle football: Secondary | ICD-10-CM | POA: Insufficient documentation

## 2022-05-17 DIAGNOSIS — I5022 Chronic systolic (congestive) heart failure: Secondary | ICD-10-CM | POA: Diagnosis not present

## 2022-05-17 DIAGNOSIS — M25561 Pain in right knee: Secondary | ICD-10-CM | POA: Insufficient documentation

## 2022-05-17 DIAGNOSIS — I110 Hypertensive heart disease with heart failure: Secondary | ICD-10-CM | POA: Insufficient documentation

## 2022-05-17 MED ORDER — NAPROXEN 500 MG PO TABS: 500.0000 mg | ORAL_TABLET | Freq: Two times a day (BID) | ORAL | 0 refills | Status: AC

## 2022-05-17 MED ORDER — HYDROCODONE-ACETAMINOPHEN 5-325 MG PO TABS: 1.0000 | ORAL_TABLET | Freq: Four times a day (QID) | ORAL | 0 refills | Status: AC | PRN

## 2022-05-17 NOTE — Discharge Instructions (Signed)
Call make an appoint with Dr. Signa Kell who is on-call for orthopedics.  He has phone number and address are listed on your discharge papers.  A prescription for naproxen and hydrocodone was sent to the pharmacy for you to begin taking today.  Ice and elevation.  Use the knee immobilizer for support and protection.  Crutches when up walking.

## 2022-05-17 NOTE — ED Provider Notes (Signed)
[facility] Provider Note    Event Date/Time   First MD Initiated Contact with Patient 05/17/22 1032     (approximate)   History   Knee Pain   HPI  Logan Kerr is a 51 y.o. male   to the ED with complaint of right knee pain.  Patient states that he noticed that his right knee began swelling yesterday.  He denies any recent injury.  He reports that he works standing and up on his feet a lot.  He did have an injury to his knee requiring surgery from a sports injury.  Patient has a history of hypertension, nonischemic cardiomyopathy and chronic systolic CHF.      Physical Exam   Triage Vital Signs: ED Triage Vitals  Enc Vitals Group     BP 05/17/22 0956 (!) 156/93     Pulse Rate 05/17/22 0956 98     Resp 05/17/22 0956 18     Temp 05/17/22 0956 98.9 F (37.2 C)     Temp Source 05/17/22 0956 Oral     SpO2 05/17/22 0956 94 %     Weight --      Height --      Head Circumference --      Peak Flow --      Pain Score 05/17/22 0950 10     Pain Loc --      Pain Edu? --      Excl. in GC? --     Most recent vital signs: Vitals:   05/17/22 0956  BP: (!) 156/93  Pulse: 98  Resp: 18  Temp: 98.9 F (37.2 C)  SpO2: 94%     General: Awake, no distress.  CV:  Good peripheral perfusion.  Resp:  Normal effort.  Abd:  No distention.  Other:  Examination of the right knee there is a well-healed surgical scar medially with minimal soft tissue edema present.  No erythema or skin discoloration noted.  Patient is able to flex and extend the right knee but expresses increased pain with weightbearing.   ED Results / Procedures / Treatments   Labs (all labs ordered are listed, but only abnormal results are displayed) Labs Reviewed - No data to display     RADIOLOGY Right knee x-ray interpreted and reviewed by myself notes degenerative changes and also postsurgical metal.  Radiology report is positive for some minimal effusion.  Post traumatic and  post operative changes are noted along with degenerative changes.    PROCEDURES:  Critical Care performed:   Procedures   MEDICATIONS ORDERED IN ED: Medications - No data to display   IMPRESSION / MDM / ASSESSMENT AND PLAN / ED COURSE  I reviewed the triage vital signs and the nursing notes.   Differential diagnosis includes, but is not limited to, right knee pain, right knee strain, gout, degenerative arthritis.  Patient presents to the ED with complaint of right knee pain without recent injury.  Patient did have surgery on his knee from an injury that occurred while he was playing football when he was younger.  Patient was made aware that x-rays did show a small effusion and both posttraumatic and postsurgical degenerative changes.  Patient was placed in a knee immobilizer and given crutches.  He is to follow-up with Dr. Signa Kell who is on-call for orthopedics.  Patient was given a prescription for hydrocodone and naproxen 500 mg twice daily with food.        FINAL CLINICAL IMPRESSION(S) /  ED DIAGNOSES   Final diagnoses:  Acute pain of right knee     Rx / DC Orders   ED Discharge Orders          Ordered    HYDROcodone-acetaminophen (NORCO/VICODIN) 5-325 MG tablet  Every 6 hours PRN        05/17/22 1104    naproxen (NAPROSYN) 500 MG tablet  2 times daily with meals        05/17/22 1104             Note:  This document was prepared using Dragon voice recognition software and may include unintentional dictation errors.   Tommi Rumps, PA-C 05/17/22 1447    Minna Antis, MD 05/17/22 1504

## 2022-05-17 NOTE — ED Triage Notes (Signed)
Pt c/o right knee pain with swelling since yesterday. Denies injury, but states he does work on his feet a lot.

## 2023-04-09 ENCOUNTER — Emergency Department
Admission: EM | Admit: 2023-04-09 | Discharge: 2023-04-10 | Discharge status: Against Medical Advice | Payer: 59 | Attending: Emergency Medicine | Admitting: Emergency Medicine

## 2023-04-09 ENCOUNTER — Emergency Department: Payer: 59

## 2023-04-09 DIAGNOSIS — R072 Precordial pain: Secondary | ICD-10-CM | POA: Insufficient documentation

## 2023-04-09 DIAGNOSIS — Z5321 Procedure and treatment not carried out due to patient leaving prior to being seen by health care provider: Secondary | ICD-10-CM | POA: Insufficient documentation

## 2023-04-09 LAB — COMPREHENSIVE METABOLIC PANEL
ALT: 22 U/L (ref 0–44)
AST: 19 U/L (ref 15–41)
Albumin: 4.2 g/dL (ref 3.5–5.0)
Alkaline Phosphatase: 78 U/L (ref 38–126)
Anion gap: 10 (ref 5–15)
BUN: 17 mg/dL (ref 6–20)
CO2: 25 mmol/L (ref 22–32)
Calcium: 9.1 mg/dL (ref 8.9–10.3)
Chloride: 99 mmol/L (ref 98–111)
Creatinine, Ser: 1.34 mg/dL — ABNORMAL HIGH (ref 0.61–1.24)
GFR, Estimated: >60 mL/min (ref >60)
Glucose, Bld: 195 mg/dL — ABNORMAL HIGH (ref 70–99)
Potassium: 3 mmol/L — ABNORMAL LOW (ref 3.5–5.1)
Sodium: 134 mmol/L — ABNORMAL LOW (ref 135–145)
Total Bilirubin: 0.7 mg/dL (ref 0.3–1.2)
Total Protein: 7.5 g/dL (ref 6.5–8.1)

## 2023-04-09 LAB — CBC WITH DIFFERENTIAL/PLATELET
Abs Immature Granulocytes: 0.03 10*3/uL (ref 0.00–0.07)
Basophils Absolute: 0 10*3/uL (ref 0.0–0.1)
Basophils Relative: 0 %
Eosinophils Absolute: 0.2 10*3/uL (ref 0.0–0.5)
Eosinophils Relative: 2 %
HCT: 37.2 % — ABNORMAL LOW (ref 39.0–52.0)
Hemoglobin: 12.4 g/dL — ABNORMAL LOW (ref 13.0–17.0)
Immature Granulocytes: 0 %
Lymphocytes Relative: 54 %
Lymphs Abs: 4.9 10*3/uL — ABNORMAL HIGH (ref 0.7–4.0)
MCH: 30.5 pg (ref 26.0–34.0)
MCHC: 33.3 g/dL (ref 30.0–36.0)
MCV: 91.4 fL (ref 80.0–100.0)
Monocytes Absolute: 0.7 10*3/uL (ref 0.1–1.0)
Monocytes Relative: 8 %
Neutro Abs: 3.3 10*3/uL (ref 1.7–7.7)
Neutrophils Relative %: 36 %
Platelets: 407 10*3/uL — ABNORMAL HIGH (ref 150–400)
RBC: 4.07 MIL/uL — ABNORMAL LOW (ref 4.22–5.81)
RDW: 12.2 % (ref 11.5–15.5)
WBC: 9.1 10*3/uL (ref 4.0–10.5)
nRBC: 0 % (ref 0.0–0.2)

## 2023-04-09 LAB — TROPONIN I (HIGH SENSITIVITY): Troponin I (High Sensitivity): 24 ng/L — ABNORMAL HIGH (ref <18)

## 2023-04-09 NOTE — ED Triage Notes (Signed)
Ambulatory to triage with steady gait with c/o midsternal chest pain that began this morning. Pt states was at work this morning when pain started. Denies any injury or strenuous activity.  Hx of prior MI. Pain pressure like in nature.

## 2023-04-09 NOTE — ED Notes (Signed)
Blue top tube sent to lab. 

## 2023-04-10 NOTE — ED Notes (Signed)
No answer when called several times from lobby 

## 2023-04-11 NOTE — ED Notes (Signed)
Attempted to call patient regarding abnormal labs. No answer, left a HIPAA compliant message.

## 2023-04-12 NOTE — ED Notes (Signed)
Attempted to call patient for second time to inform of abnormal labs and need for follow up without success. HIPAA compliant message left with initial call.

## 2023-05-01 DIAGNOSIS — Z1152 Encounter for screening for COVID-19: Secondary | ICD-10-CM | POA: Diagnosis not present

## 2023-05-01 DIAGNOSIS — R0789 Other chest pain: Secondary | ICD-10-CM | POA: Diagnosis not present

## 2023-05-01 DIAGNOSIS — F17210 Nicotine dependence, cigarettes, uncomplicated: Secondary | ICD-10-CM | POA: Diagnosis not present

## 2023-05-01 DIAGNOSIS — R0602 Shortness of breath: Secondary | ICD-10-CM | POA: Diagnosis not present

## 2023-05-01 DIAGNOSIS — I214 Non-ST elevation (NSTEMI) myocardial infarction: Secondary | ICD-10-CM | POA: Diagnosis not present

## 2023-05-01 DIAGNOSIS — R202 Paresthesia of skin: Secondary | ICD-10-CM | POA: Diagnosis not present

## 2023-05-02 DIAGNOSIS — I249 Acute ischemic heart disease, unspecified: Secondary | ICD-10-CM | POA: Diagnosis not present

## 2023-05-02 DIAGNOSIS — I5023 Acute on chronic systolic (congestive) heart failure: Secondary | ICD-10-CM | POA: Diagnosis not present

## 2023-05-02 DIAGNOSIS — I429 Cardiomyopathy, unspecified: Secondary | ICD-10-CM | POA: Diagnosis not present

## 2023-05-02 DIAGNOSIS — I110 Hypertensive heart disease with heart failure: Secondary | ICD-10-CM | POA: Diagnosis not present

## 2023-05-02 DIAGNOSIS — I5022 Chronic systolic (congestive) heart failure: Secondary | ICD-10-CM | POA: Diagnosis not present

## 2023-05-02 DIAGNOSIS — R079 Chest pain, unspecified: Secondary | ICD-10-CM | POA: Diagnosis not present

## 2023-05-02 DIAGNOSIS — J811 Chronic pulmonary edema: Secondary | ICD-10-CM | POA: Diagnosis not present

## 2023-05-02 DIAGNOSIS — R9431 Abnormal electrocardiogram [ECG] [EKG]: Secondary | ICD-10-CM | POA: Diagnosis not present

## 2023-05-02 DIAGNOSIS — I2510 Atherosclerotic heart disease of native coronary artery without angina pectoris: Secondary | ICD-10-CM | POA: Diagnosis not present

## 2023-05-02 DIAGNOSIS — E785 Hyperlipidemia, unspecified: Secondary | ICD-10-CM | POA: Diagnosis not present

## 2023-05-02 DIAGNOSIS — Z7902 Long term (current) use of antithrombotics/antiplatelets: Secondary | ICD-10-CM | POA: Diagnosis not present

## 2023-05-02 DIAGNOSIS — Z7984 Long term (current) use of oral hypoglycemic drugs: Secondary | ICD-10-CM | POA: Diagnosis not present

## 2023-05-02 DIAGNOSIS — F17210 Nicotine dependence, cigarettes, uncomplicated: Secondary | ICD-10-CM | POA: Diagnosis not present

## 2023-05-02 DIAGNOSIS — I428 Other cardiomyopathies: Secondary | ICD-10-CM | POA: Diagnosis not present

## 2023-05-02 DIAGNOSIS — I2489 Other forms of acute ischemic heart disease: Secondary | ICD-10-CM | POA: Diagnosis not present

## 2023-05-02 DIAGNOSIS — Z79899 Other long term (current) drug therapy: Secondary | ICD-10-CM | POA: Diagnosis not present

## 2023-05-02 DIAGNOSIS — Z20822 Contact with and (suspected) exposure to covid-19: Secondary | ICD-10-CM | POA: Diagnosis not present

## 2023-05-02 DIAGNOSIS — R0989 Other specified symptoms and signs involving the circulatory and respiratory systems: Secondary | ICD-10-CM | POA: Diagnosis not present

## 2023-05-02 DIAGNOSIS — I10 Essential (primary) hypertension: Secondary | ICD-10-CM | POA: Diagnosis not present

## 2023-05-02 DIAGNOSIS — Z7982 Long term (current) use of aspirin: Secondary | ICD-10-CM | POA: Diagnosis not present

## 2023-05-02 DIAGNOSIS — E119 Type 2 diabetes mellitus without complications: Secondary | ICD-10-CM | POA: Diagnosis not present

## 2023-05-02 DIAGNOSIS — I214 Non-ST elevation (NSTEMI) myocardial infarction: Secondary | ICD-10-CM | POA: Diagnosis not present

## 2023-05-02 DIAGNOSIS — I517 Cardiomegaly: Secondary | ICD-10-CM | POA: Diagnosis not present

## 2023-05-03 DIAGNOSIS — I5022 Chronic systolic (congestive) heart failure: Secondary | ICD-10-CM | POA: Diagnosis not present

## 2023-05-03 DIAGNOSIS — I214 Non-ST elevation (NSTEMI) myocardial infarction: Secondary | ICD-10-CM | POA: Diagnosis not present

## 2023-05-03 DIAGNOSIS — I110 Hypertensive heart disease with heart failure: Secondary | ICD-10-CM | POA: Diagnosis not present

## 2023-05-06 DIAGNOSIS — E785 Hyperlipidemia, unspecified: Secondary | ICD-10-CM | POA: Diagnosis not present

## 2023-05-06 DIAGNOSIS — I428 Other cardiomyopathies: Secondary | ICD-10-CM | POA: Diagnosis not present

## 2023-05-06 DIAGNOSIS — I110 Hypertensive heart disease with heart failure: Secondary | ICD-10-CM | POA: Diagnosis not present

## 2023-05-06 DIAGNOSIS — I2489 Other forms of acute ischemic heart disease: Secondary | ICD-10-CM | POA: Diagnosis not present

## 2023-05-06 DIAGNOSIS — I5023 Acute on chronic systolic (congestive) heart failure: Secondary | ICD-10-CM | POA: Diagnosis not present

## 2023-05-06 DIAGNOSIS — R42 Dizziness and giddiness: Secondary | ICD-10-CM | POA: Diagnosis not present

## 2023-05-06 DIAGNOSIS — I5022 Chronic systolic (congestive) heart failure: Secondary | ICD-10-CM | POA: Diagnosis not present

## 2023-05-06 DIAGNOSIS — E119 Type 2 diabetes mellitus without complications: Secondary | ICD-10-CM | POA: Diagnosis not present

## 2023-05-06 DIAGNOSIS — R079 Chest pain, unspecified: Secondary | ICD-10-CM | POA: Diagnosis not present

## 2023-05-06 DIAGNOSIS — I161 Hypertensive emergency: Secondary | ICD-10-CM | POA: Diagnosis not present

## 2023-05-06 DIAGNOSIS — J811 Chronic pulmonary edema: Secondary | ICD-10-CM | POA: Diagnosis not present

## 2023-05-06 DIAGNOSIS — R9431 Abnormal electrocardiogram [ECG] [EKG]: Secondary | ICD-10-CM | POA: Diagnosis not present

## 2023-05-06 DIAGNOSIS — I10 Essential (primary) hypertension: Secondary | ICD-10-CM | POA: Diagnosis not present

## 2023-05-06 DIAGNOSIS — Z7984 Long term (current) use of oral hypoglycemic drugs: Secondary | ICD-10-CM | POA: Diagnosis not present

## 2023-05-06 DIAGNOSIS — F17210 Nicotine dependence, cigarettes, uncomplicated: Secondary | ICD-10-CM | POA: Diagnosis not present

## 2023-05-06 DIAGNOSIS — R7989 Other specified abnormal findings of blood chemistry: Secondary | ICD-10-CM | POA: Diagnosis not present

## 2023-05-06 DIAGNOSIS — R0789 Other chest pain: Secondary | ICD-10-CM | POA: Diagnosis not present

## 2023-05-06 DIAGNOSIS — Z7982 Long term (current) use of aspirin: Secondary | ICD-10-CM | POA: Diagnosis not present

## 2023-05-20 DIAGNOSIS — R42 Dizziness and giddiness: Secondary | ICD-10-CM | POA: Diagnosis not present

## 2023-05-20 DIAGNOSIS — I10 Essential (primary) hypertension: Secondary | ICD-10-CM | POA: Diagnosis not present

## 2023-05-20 DIAGNOSIS — F17200 Nicotine dependence, unspecified, uncomplicated: Secondary | ICD-10-CM | POA: Diagnosis not present

## 2023-05-20 DIAGNOSIS — Z7982 Long term (current) use of aspirin: Secondary | ICD-10-CM | POA: Diagnosis not present

## 2023-05-20 DIAGNOSIS — I2510 Atherosclerotic heart disease of native coronary artery without angina pectoris: Secondary | ICD-10-CM | POA: Diagnosis not present

## 2023-05-20 DIAGNOSIS — Z79899 Other long term (current) drug therapy: Secondary | ICD-10-CM | POA: Diagnosis not present

## 2023-05-20 DIAGNOSIS — I509 Heart failure, unspecified: Secondary | ICD-10-CM | POA: Diagnosis not present

## 2023-05-20 DIAGNOSIS — Z7984 Long term (current) use of oral hypoglycemic drugs: Secondary | ICD-10-CM | POA: Diagnosis not present

## 2023-05-20 DIAGNOSIS — I110 Hypertensive heart disease with heart failure: Secondary | ICD-10-CM | POA: Diagnosis not present

## 2023-05-20 DIAGNOSIS — I252 Old myocardial infarction: Secondary | ICD-10-CM | POA: Diagnosis not present

## 2023-05-20 DIAGNOSIS — R519 Headache, unspecified: Secondary | ICD-10-CM | POA: Diagnosis not present

## 2023-05-20 DIAGNOSIS — R7989 Other specified abnormal findings of blood chemistry: Secondary | ICD-10-CM | POA: Diagnosis not present

## 2023-05-20 DIAGNOSIS — R9431 Abnormal electrocardiogram [ECG] [EKG]: Secondary | ICD-10-CM | POA: Diagnosis not present

## 2023-05-20 DIAGNOSIS — I5022 Chronic systolic (congestive) heart failure: Secondary | ICD-10-CM | POA: Diagnosis not present

## 2023-05-20 DIAGNOSIS — I517 Cardiomegaly: Secondary | ICD-10-CM | POA: Diagnosis not present

## 2023-05-21 DIAGNOSIS — I10 Essential (primary) hypertension: Secondary | ICD-10-CM | POA: Diagnosis not present

## 2023-05-21 DIAGNOSIS — I110 Hypertensive heart disease with heart failure: Secondary | ICD-10-CM | POA: Diagnosis not present

## 2023-05-21 DIAGNOSIS — E118 Type 2 diabetes mellitus with unspecified complications: Secondary | ICD-10-CM | POA: Diagnosis not present

## 2023-05-22 DIAGNOSIS — I10 Essential (primary) hypertension: Secondary | ICD-10-CM | POA: Diagnosis not present

## 2023-06-24 DIAGNOSIS — I5022 Chronic systolic (congestive) heart failure: Secondary | ICD-10-CM | POA: Diagnosis not present

## 2023-06-24 DIAGNOSIS — E782 Mixed hyperlipidemia: Secondary | ICD-10-CM | POA: Diagnosis not present

## 2023-06-24 DIAGNOSIS — I110 Hypertensive heart disease with heart failure: Secondary | ICD-10-CM | POA: Diagnosis not present

## 2023-06-24 DIAGNOSIS — I160 Hypertensive urgency: Secondary | ICD-10-CM | POA: Diagnosis not present

## 2023-06-24 DIAGNOSIS — I2510 Atherosclerotic heart disease of native coronary artery without angina pectoris: Secondary | ICD-10-CM | POA: Diagnosis not present

## 2023-07-09 ENCOUNTER — Emergency Department
Admission: EM | Admit: 2023-07-09 | Discharge: 2023-07-09 | Disposition: A | Discharge status: Home / Self Care | Payer: 59 | Attending: Emergency Medicine | Admitting: Emergency Medicine

## 2023-07-09 ENCOUNTER — Other Ambulatory Visit: Payer: Self-pay

## 2023-07-09 DIAGNOSIS — I10 Essential (primary) hypertension: Secondary | ICD-10-CM | POA: Diagnosis not present

## 2023-07-09 DIAGNOSIS — Z7984 Long term (current) use of oral hypoglycemic drugs: Secondary | ICD-10-CM | POA: Insufficient documentation

## 2023-07-09 DIAGNOSIS — H538 Other visual disturbances: Secondary | ICD-10-CM | POA: Insufficient documentation

## 2023-07-09 DIAGNOSIS — R944 Abnormal results of kidney function studies: Secondary | ICD-10-CM | POA: Insufficient documentation

## 2023-07-09 DIAGNOSIS — R739 Hyperglycemia, unspecified: Secondary | ICD-10-CM

## 2023-07-09 DIAGNOSIS — E1165 Type 2 diabetes mellitus with hyperglycemia: Secondary | ICD-10-CM | POA: Insufficient documentation

## 2023-07-09 LAB — CBG MONITORING, ED
Glucose-Capillary: 514 mg/dL (ref 70–99)
Glucose-Capillary: 340 mg/dL — ABNORMAL HIGH (ref 70–99)

## 2023-07-09 LAB — BASIC METABOLIC PANEL
Anion gap: 12 (ref 5–15)
BUN: 28 mg/dL — ABNORMAL HIGH (ref 6–20)
CO2: 20 mmol/L — ABNORMAL LOW (ref 22–32)
Calcium: 9.1 mg/dL (ref 8.9–10.3)
Chloride: 96 mmol/L — ABNORMAL LOW (ref 98–111)
Creatinine, Ser: 2.31 mg/dL — ABNORMAL HIGH (ref 0.61–1.24)
GFR, Estimated: 33 mL/min — ABNORMAL LOW (ref >60)
Glucose, Bld: 535 mg/dL (ref 70–99)
Potassium: 3.9 mmol/L (ref 3.5–5.1)
Sodium: 128 mmol/L — ABNORMAL LOW (ref 135–145)

## 2023-07-09 LAB — CBC
HCT: 34.9 % — ABNORMAL LOW (ref 39.0–52.0)
Hemoglobin: 11.9 g/dL — ABNORMAL LOW (ref 13.0–17.0)
MCH: 29.6 pg (ref 26.0–34.0)
MCHC: 34.1 g/dL (ref 30.0–36.0)
MCV: 86.8 fL (ref 80.0–100.0)
Platelets: 350 10*3/uL (ref 150–400)
RBC: 4.02 MIL/uL — ABNORMAL LOW (ref 4.22–5.81)
RDW: 11.9 % (ref 11.5–15.5)
WBC: 9.1 10*3/uL (ref 4.0–10.5)
nRBC: 0 % (ref 0.0–0.2)

## 2023-07-09 MED ORDER — METFORMIN HCL 1000 MG PO TABS: 1000.0000 mg | ORAL_TABLET | Freq: Two times a day (BID) | ORAL | 0 refills | Status: AC

## 2023-07-09 MED ORDER — SODIUM CHLORIDE 0.9 % IV BOLUS
1000.0000 mL | Freq: Once | INTRAVENOUS | Status: AC
  Administered 2023-07-09: 1000 mL via INTRAVENOUS

## 2023-07-09 NOTE — ED Triage Notes (Addendum)
Pt to ED for hyperglycemia. States was taken off metformin. Hx CHF

## 2023-07-09 NOTE — ED Provider Notes (Signed)
   [facility] Provider Note    Event Date/Time   First MD Initiated Contact with Patient 07/09/23 [phone]     (approximate)   History   Hyperglycemia   HPI  Logan Kerr is a 52 y.o. male past medical history send     Physical Exam   Triage Vital Signs: ED Triage Vitals  Encounter Vitals Group     BP 07/09/23 1432 (!) 169/107     Systolic BP Percentile --      Diastolic BP Percentile --      Pulse Rate 07/09/23 1432 91     Resp 07/09/23 1433 18     Temp 07/09/23 1432 98.4 F (36.9 C)     Temp src --      SpO2 07/09/23 1432 97 %     Weight 07/09/23 1431 238 lb (108 kg)     Height 07/09/23 1431 5\' 10"  (1.778 m)     Head Circumference --      Peak Flow --      Pain Score 07/09/23 1431 0     Pain Loc --      Pain Education --      Exclude from Growth Chart --     Most recent vital signs: Vitals:   07/09/23 1432 07/09/23 1433  BP: (!) 169/107   Pulse: 91   Resp:  18  Temp: 98.4 F (36.9 C)   SpO2: 97%     Physical Exam  IMPRESSION / MDM / ASSESSMENT AND PLAN / ED COURSE  I reviewed the triage vital signs and the nursing notes.  *** EKG  I, Corena Herter, the attending physician, personally viewed and interpreted this ECG.   Rate: Normal  Rhythm: Normal sinus  Axis: Normal  Intervals: Normal  ST&T Change: None  No tachycardic or bradycardic dysrhythmias while on cardiac telemetry.  RADIOLOGY I independently reviewed imaging, my interpretation of imaging: ***  LABS (all labs ordered are listed, but only abnormal results are displayed) Labs interpreted as -    Labs Reviewed  CBC - Abnormal; Notable for the following components:      Result Value   RBC 4.02 (*)    Hemoglobin 11.9 (*)    HCT 34.9 (*)    All other components within normal limits  BASIC METABOLIC PANEL - Abnormal; Notable for the following components:   Sodium 128 (*)    Chloride 96 (*)    CO2 20 (*)    Glucose, Bld 535 (*)    BUN 28 (*)     Creatinine, Ser 2.31 (*)    GFR, Estimated 33 (*)    All other components within normal limits  CBG MONITORING, ED - Abnormal; Notable for the following components:   Glucose-Capillary 514 (*)    All other components within normal limits     MDM       PROCEDURES:  Critical Care performed: No  Procedures  Patient's presentation is most consistent with {EM COPA:27473}   MEDICATIONS ORDERED IN ED: Medications  sodium chloride 0.9 % bolus 1,000 mL (1,000 mLs Intravenous New Bag/Given 07/09/23 1511)    FINAL CLINICAL IMPRESSION(S) / ED DIAGNOSES   Final diagnoses:  None     Rx / DC Orders   ED Discharge Orders     None        Note:  This document was prepared using Dragon voice recognition software and may include unintentional dictation errors.

## 2023-07-09 NOTE — Discharge Instructions (Addendum)
You were seen in the emergency department and initially had a glucose level in the 500s.  You were given IV fluids.  You were restarted on your metformin and your glucose level improved significantly while in the ER.  It is important that you watch what you eat and drink and stay hydrated with plenty of water.  Check your glucose frequently until you can follow-up with your primary care physician.  Return to the emergency department for any worsening symptoms.  Start taking metformin until you could get switched to a new diabetic medication with your primary care provider.

## 2023-12-11 ENCOUNTER — Emergency Department: Payer: 59

## 2023-12-11 ENCOUNTER — Other Ambulatory Visit: Payer: Self-pay

## 2023-12-11 ENCOUNTER — Emergency Department
Admission: EM | Admit: 2023-12-11 | Discharge: 2023-12-11 | Disposition: A | Discharge status: Home / Self Care | Payer: 59 | Attending: Emergency Medicine | Admitting: Emergency Medicine

## 2023-12-11 DIAGNOSIS — E119 Type 2 diabetes mellitus without complications: Secondary | ICD-10-CM | POA: Insufficient documentation

## 2023-12-11 DIAGNOSIS — R0789 Other chest pain: Secondary | ICD-10-CM | POA: Diagnosis not present

## 2023-12-11 DIAGNOSIS — I509 Heart failure, unspecified: Secondary | ICD-10-CM | POA: Insufficient documentation

## 2023-12-11 DIAGNOSIS — R0989 Other specified symptoms and signs involving the circulatory and respiratory systems: Secondary | ICD-10-CM | POA: Diagnosis not present

## 2023-12-11 DIAGNOSIS — R079 Chest pain, unspecified: Secondary | ICD-10-CM | POA: Insufficient documentation

## 2023-12-11 DIAGNOSIS — I110 Hypertensive heart disease with heart failure: Secondary | ICD-10-CM | POA: Diagnosis not present

## 2023-12-11 LAB — CBC
HCT: 36.2 % — ABNORMAL LOW (ref 39.0–52.0)
Hemoglobin: 12.2 g/dL — ABNORMAL LOW (ref 13.0–17.0)
MCH: 30.2 pg (ref 26.0–34.0)
MCHC: 33.7 g/dL (ref 30.0–36.0)
MCV: 89.6 fL (ref 80.0–100.0)
Platelets: 413 10*3/uL — ABNORMAL HIGH (ref 150–400)
RBC: 4.04 MIL/uL — ABNORMAL LOW (ref 4.22–5.81)
RDW: 12.1 % (ref 11.5–15.5)
WBC: 14.3 10*3/uL — ABNORMAL HIGH (ref 4.0–10.5)
nRBC: 0 % (ref 0.0–0.2)

## 2023-12-11 LAB — TROPONIN I (HIGH SENSITIVITY)
Troponin I (High Sensitivity): 8 ng/L (ref <18)
Troponin I (High Sensitivity): 7 ng/L (ref <18)

## 2023-12-11 LAB — BASIC METABOLIC PANEL
Anion gap: 13 (ref 5–15)
BUN: 21 mg/dL — ABNORMAL HIGH (ref 6–20)
CO2: 25 mmol/L (ref 22–32)
Calcium: 9.8 mg/dL (ref 8.9–10.3)
Chloride: 97 mmol/L — ABNORMAL LOW (ref 98–111)
Creatinine, Ser: 1.16 mg/dL (ref 0.61–1.24)
GFR, Estimated: >60 mL/min (ref >60)
Glucose, Bld: 210 mg/dL — ABNORMAL HIGH (ref 70–99)
Potassium: 3.8 mmol/L (ref 3.5–5.1)
Sodium: 135 mmol/L (ref 135–145)

## 2023-12-11 LAB — D-DIMER, QUANTITATIVE: D-Dimer, Quant: 0.27 ug{FEU}/mL (ref 0.00–0.50)

## 2023-12-11 MED ORDER — IBUPROFEN 600 MG PO TABS
600.0000 mg | ORAL_TABLET | Freq: Once | ORAL | Status: AC
  Administered 2023-12-11: 600 mg via ORAL
  Filled 2023-12-11: qty 1

## 2023-12-11 MED ORDER — SODIUM CHLORIDE 0.9 % IV BOLUS
500.0000 mL | Freq: Once | INTRAVENOUS | Status: AC
  Administered 2023-12-11: 500 mL via INTRAVENOUS

## 2023-12-11 MED ORDER — NITROGLYCERIN 0.4 MG SL SUBL
0.4000 mg | SUBLINGUAL_TABLET | Freq: Once | SUBLINGUAL | Status: DC
  Filled 2023-12-11: qty 1

## 2023-12-11 NOTE — ED Triage Notes (Signed)
Pt to ED For chest pain started yesterday evening. Denies n/v/shob. Pt in PD custody from jail. NAD noted.

## 2023-12-11 NOTE — ED Notes (Signed)
See triage notes. Patient c/o chest pain that started yesterday evening. Denies nausea, vomiting and shortness of breath. NAD noted.

## 2023-12-11 NOTE — Discharge Instructions (Signed)
Laboratory workup today was reassuring.  Markers for your heart were negative x 2.  Your EKG was also reassuring and consistent with prior ones.  No evidence of blood clots.  No evidence of pneumonia.  Please reach out to your cardiology team and have follow-up with them in the next 1 to 2 weeks.

## 2023-12-11 NOTE — ED Provider Notes (Signed)
[facility] Provider Note    Event Date/Time   First MD Initiated Contact with Patient 12/11/23 [phone]     (approximate)   History   Chest Pain   HPI Logan Kerr is a 52 y.o. male with history of HTN, HLD, tobacco abuse, CHF, DM2 presenting today for chest pain.  Patient states yesterday he had onset of vague left-sided chest pain.  No radiation of the pain.  Denied shortness of breath, nausea, sweating, vomiting, abdominal pain, cough, congestion, leg swelling.  Otherwise taking his medications as prescribed.  Denies any current tobacco use or other drug use.  Chart review: Patient had admission at [facility] on 05/02/2023 for NSTEMI.  This was also in the setting of severe hypertension.  Left heart cath performed at that time showed no significant CAD and it was all suspected secondary to uncontrolled hypertension.  Has been on blood pressure medication since which is well-controlled this.     Physical Exam   Triage Vital Signs: ED Triage Vitals  Encounter Vitals Group     BP 12/11/23 0427 114/82     Systolic BP Percentile --      Diastolic BP Percentile --      Pulse Rate 12/11/23 0427 98     Resp 12/11/23 0427 18     Temp 12/11/23 0427 97.7 F (36.5 C)     Temp src --      SpO2 12/11/23 0427 96 %     Weight 12/11/23 0426 220 lb (99.8 kg)     Height 12/11/23 0426 5\' 10"  (1.778 m)     Head Circumference --      Peak Flow --      Pain Score 12/11/23 0424 8     Pain Loc --      Pain Education --      Exclude from Growth Chart --     Most recent vital signs: Vitals:   12/11/23 0845 12/11/23 0900  BP: 120/72 100/73  Pulse: 98 90  Resp: 18   Temp: 97.7 F (36.5 C)   SpO2: 96% 97%   Physical Exam: I have reviewed the vital signs and nursing notes. General: Awake, alert, no acute distress.  Nontoxic appearing. Head:  Atraumatic, normocephalic.   ENT:  EOM intact, PERRL. Oral mucosa is pink and moist with no lesions. Neck: Neck is supple with  full range of motion, No meningeal signs. Cardiovascular:  RRR, No murmurs. Peripheral pulses palpable and equal bilaterally. Respiratory:  Symmetrical chest wall expansion.  No rhonchi, rales, or wheezes.  Good air movement throughout.  No use of accessory muscles.   Musculoskeletal:  No cyanosis or edema. Moving extremities with full ROM Abdomen:  Soft, nontender, nondistended. Neuro:  GCS 15, moving all four extremities, interacting appropriately. Speech clear. Psych:  Calm, appropriate.   Skin:  Warm, dry, no rash.    ED Results / Procedures / Treatments   Labs (all labs ordered are listed, but only abnormal results are displayed) Labs Reviewed  BASIC METABOLIC PANEL - Abnormal; Notable for the following components:      Result Value   Chloride 97 (*)    Glucose, Bld 210 (*)    BUN 21 (*)    All other components within normal limits  CBC - Abnormal; Notable for the following components:   WBC 14.3 (*)    RBC 4.04 (*)    Hemoglobin 12.2 (*)    HCT 36.2 (*)    Platelets 413 (*)  All other components within normal limits  D-DIMER, QUANTITATIVE  TROPONIN I (HIGH SENSITIVITY)  TROPONIN I (HIGH SENSITIVITY)     EKG My EKG interpretation: Rate of 94, normal sinus rhythm, normal axis.  T wave inversions present in lead II, III, aVF, V5, and V6.  No acute ST elevation or depression.  This EKG is overall comparable to most recent 1 on 07/09/2023.   RADIOLOGY Independently interpreted chest x-ray with no acute pathology   PROCEDURES:  Critical Care performed: No  Procedures   MEDICATIONS ORDERED IN ED: Medications  nitroGLYCERIN (NITROSTAT) SL tablet 0.4 mg (0.4 mg Sublingual Not Given 12/11/23 0858)  ibuprofen (ADVIL) tablet 600 mg (has no administration in time range)  sodium chloride 0.9 % bolus 500 mL (500 mLs Intravenous New Bag/Given 12/11/23 0855)     IMPRESSION / MDM / ASSESSMENT AND PLAN / ED COURSE  I reviewed the triage vital signs and the nursing  notes.                              Differential diagnosis includes, but is not limited to, stable angina, ACS, pneumonia, pneumothorax, reflux, musculoskeletal pain  Patient's presentation is most consistent with acute complicated illness / injury requiring diagnostic workup.  Patient is a 52 year old male presenting today for vague left-sided chest pain with no associated symptoms.  Vital signs are stable and physical exam unremarkable.  EKG consistent with prior ones with no other acute ischemic changes.  CBC with mild leukocytosis of undetermined significance with no correlating infectious symptoms.  BMP largely unremarkable.  Troponins negative x 2.  D-dimer negative.  No need for CTA of chest at this time.  Patient reassessed with no ongoing pain symptoms.  Given recent evaluation with cardiac cath, do not think patient needs to stay for further evaluation at this time.  Safe for discharge and will have him follow-up with cardiology outpatient.  The patient is on the cardiac monitor to evaluate for evidence of arrhythmia and/or significant heart rate changes. Clinical Course as of 12/11/23 1023  Wed Dec 11, 2023  0912 Troponin I (High Sensitivity): 7 Stable and negative x 2 [DW]  1022 Reassessed patient.  No ongoing pain symptoms.  Will discharge [DW]    Clinical Course User Index [DW] Janith Lima, MD     FINAL CLINICAL IMPRESSION(S) / ED DIAGNOSES   Final diagnoses:  Chest pain, unspecified type     Rx / DC Orders   ED Discharge Orders     None        Note:  This document was prepared using Dragon voice recognition software and may include unintentional dictation errors.   Janith Lima, MD 12/11/23 [phone]
